# Patient Record
Sex: Male | Born: 1990 | Race: White | Hispanic: No | Marital: Married | Smoking: Never smoker
Health system: Southern US, Community
[De-identification: ages and names within clinical notes are randomized; demographics above are authoritative.]

## PROBLEM LIST (undated history)

## (undated) DIAGNOSIS — I10 Essential (primary) hypertension: Secondary | ICD-10-CM

## (undated) DIAGNOSIS — F3340 Major depressive disorder, recurrent, in remission, unspecified: Secondary | ICD-10-CM

## (undated) HISTORY — DX: Major depressive disorder, recurrent, in remission, unspecified: F33.40

## (undated) HISTORY — DX: Essential (primary) hypertension: I10

---

## 2008-11-13 ENCOUNTER — Emergency Department (HOSPITAL_COMMUNITY): Admission: EM | Admit: 2008-11-13 | Discharge: 2008-11-13 | Payer: Self-pay | Admitting: Emergency Medicine

## 2011-01-04 ENCOUNTER — Ambulatory Visit: Payer: Self-pay

## 2011-01-04 DIAGNOSIS — J039 Acute tonsillitis, unspecified: Secondary | ICD-10-CM

## 2011-01-04 DIAGNOSIS — R509 Fever, unspecified: Secondary | ICD-10-CM

## 2011-01-30 ENCOUNTER — Other Ambulatory Visit (HOSPITAL_COMMUNITY): Payer: Self-pay | Admitting: Orthopedic Surgery

## 2011-01-30 ENCOUNTER — Other Ambulatory Visit (HOSPITAL_COMMUNITY): Payer: Self-pay | Admitting: *Deleted

## 2011-01-30 ENCOUNTER — Encounter (HOSPITAL_COMMUNITY): Payer: Self-pay | Admitting: Pharmacy Technician

## 2011-01-30 MED ORDER — DEXTROSE 5 % IV SOLN: 2.0000 g | INTRAVENOUS | Status: DC

## 2011-01-31 ENCOUNTER — Encounter (HOSPITAL_COMMUNITY): Payer: Self-pay | Admitting: *Deleted

## 2011-01-31 ENCOUNTER — Inpatient Hospital Stay (HOSPITAL_COMMUNITY): Payer: No Typology Code available for payment source | Admitting: Anesthesiology

## 2011-01-31 ENCOUNTER — Encounter (HOSPITAL_COMMUNITY): Payer: Self-pay | Admitting: Anesthesiology

## 2011-01-31 ENCOUNTER — Encounter (HOSPITAL_COMMUNITY)
Admission: EM | Disposition: A | Discharge status: Home / Self Care | Payer: Self-pay | Source: Ambulatory Visit | Attending: Orthopedic Surgery

## 2011-01-31 ENCOUNTER — Ambulatory Visit (HOSPITAL_COMMUNITY)
Admission: EM | Admit: 2011-01-31 | Discharge: 2011-01-31 | Disposition: A | Discharge status: Home / Self Care | Payer: No Typology Code available for payment source | Source: Ambulatory Visit | Attending: Orthopedic Surgery | Admitting: Orthopedic Surgery

## 2011-01-31 DIAGNOSIS — S86011A Strain of right Achilles tendon, initial encounter: Secondary | ICD-10-CM

## 2011-01-31 DIAGNOSIS — S93499A Sprain of other ligament of unspecified ankle, initial encounter: Secondary | ICD-10-CM | POA: Insufficient documentation

## 2011-01-31 DIAGNOSIS — S96819A Strain of other specified muscles and tendons at ankle and foot level, unspecified foot, initial encounter: Secondary | ICD-10-CM | POA: Insufficient documentation

## 2011-01-31 DIAGNOSIS — Y9367 Activity, basketball: Secondary | ICD-10-CM | POA: Insufficient documentation

## 2011-01-31 HISTORY — PX: ACHILLES TENDON SURGERY: SHX542

## 2011-01-31 LAB — CBC
Hemoglobin: 14.9 g/dL (ref 13.0–17.0)
Platelets: 191 10*3/uL (ref 150–400)
RBC: 4.9 MIL/uL (ref 4.22–5.81)
WBC: 4.7 10*3/uL (ref 4.0–10.5)

## 2011-01-31 LAB — SURGICAL PCR SCREEN
MRSA, PCR: NEGATIVE
Staphylococcus aureus: POSITIVE — AB

## 2011-01-31 SURGERY — REPAIR, TENDON, ACHILLES
Anesthesia: General | Site: Leg Lower | Laterality: Right | Wound class: Clean

## 2011-01-31 MED ORDER — PROPOFOL 10 MG/ML IV EMUL
INTRAVENOUS | Status: DC | PRN
  Administered 2011-01-31: 400 mg via INTRAVENOUS

## 2011-01-31 MED ORDER — GLYCOPYRROLATE 0.2 MG/ML IJ SOLN
INTRAMUSCULAR | Status: DC | PRN
  Administered 2011-01-31: .7 mg via INTRAVENOUS

## 2011-01-31 MED ORDER — LACTATED RINGERS IV SOLN
INTRAVENOUS | Status: DC | PRN
  Administered 2011-01-31 (×2): via INTRAVENOUS

## 2011-01-31 MED ORDER — HYDROCODONE-ACETAMINOPHEN 5-500 MG PO TABS: 1.0000 | ORAL_TABLET | Freq: Four times a day (QID) | ORAL | Status: AC | PRN

## 2011-01-31 MED ORDER — NEOSTIGMINE METHYLSULFATE 1 MG/ML IJ SOLN
INTRAMUSCULAR | Status: DC | PRN
  Administered 2011-01-31: 4 mg via INTRAVENOUS

## 2011-01-31 MED ORDER — ROCURONIUM BROMIDE 100 MG/10ML IV SOLN
INTRAVENOUS | Status: DC | PRN
  Administered 2011-01-31: 50 mg via INTRAVENOUS

## 2011-01-31 MED ORDER — 0.9 % SODIUM CHLORIDE (POUR BTL) OPTIME
TOPICAL | Status: DC | PRN
  Administered 2011-01-31: 1000 mL

## 2011-01-31 MED ORDER — HYDROCODONE-ACETAMINOPHEN 5-325 MG PO TABS: 1.0000 | ORAL_TABLET | Freq: Four times a day (QID) | ORAL | Status: DC | PRN

## 2011-01-31 MED ORDER — MORPHINE SULFATE 4 MG/ML IJ SOLN: 0.0500 mg/kg | INTRAMUSCULAR | Status: DC | PRN

## 2011-01-31 MED ORDER — BUPIVACAINE-EPINEPHRINE PF 0.5-1:200000 % IJ SOLN
INTRAMUSCULAR | Status: DC | PRN
  Administered 2011-01-31: 25 mL

## 2011-01-31 MED ORDER — ONDANSETRON HCL 4 MG/2ML IJ SOLN
INTRAMUSCULAR | Status: DC | PRN
  Administered 2011-01-31: 4 mg via INTRAVENOUS

## 2011-01-31 MED ORDER — FENTANYL CITRATE 0.05 MG/ML IJ SOLN
INTRAMUSCULAR | Status: DC | PRN
  Administered 2011-01-31: 50 ug via INTRAVENOUS
  Administered 2011-01-31: 100 ug via INTRAVENOUS

## 2011-01-31 MED ORDER — ONDANSETRON HCL 4 MG/2ML IJ SOLN: 4.0000 mg | Freq: Once | INTRAMUSCULAR | Status: DC | PRN

## 2011-01-31 MED ORDER — MIDAZOLAM HCL 5 MG/5ML IJ SOLN
INTRAMUSCULAR | Status: DC | PRN
  Administered 2011-01-31 (×2): 2 mg via INTRAVENOUS

## 2011-01-31 MED ORDER — OXYCODONE-ACETAMINOPHEN 5-325 MG PO TABS: 1.0000 | ORAL_TABLET | ORAL | Status: AC | PRN

## 2011-01-31 MED ORDER — HYDROMORPHONE HCL PF 1 MG/ML IJ SOLN: 0.2500 mg | INTRAMUSCULAR | Status: DC | PRN

## 2011-01-31 MED ORDER — MUPIROCIN 2 % EX OINT
TOPICAL_OINTMENT | Freq: Two times a day (BID) | CUTANEOUS | Status: DC
  Administered 2011-01-31: 1 via NASAL
  Filled 2011-01-31: qty 22

## 2011-01-31 MED ORDER — CEFAZOLIN SODIUM-DEXTROSE 2-3 GM-% IV SOLR
2.0000 g | INTRAVENOUS | Status: AC
  Administered 2011-01-31: 2 g via INTRAVENOUS

## 2011-01-31 MED ORDER — MEPERIDINE HCL 25 MG/ML IJ SOLN: 6.2500 mg | INTRAMUSCULAR | Status: DC | PRN

## 2011-01-31 SURGICAL SUPPLY — 43 items
5.5mm althread L-15 size 2 maxbraid (Anchor) ×4 IMPLANT
BANDAGE COBAN STERILE 6 (GAUZE/BANDAGES/DRESSINGS) ×2 IMPLANT
BLADE SURG 10 STRL SS (BLADE) ×2 IMPLANT
BNDG COHESIVE 6X5 TAN STRL LF (GAUZE/BANDAGES/DRESSINGS) ×4 IMPLANT
BNDG ESMARK 4X9 LF (GAUZE/BANDAGES/DRESSINGS) IMPLANT
BNDG GAUZE STRTCH 6 (GAUZE/BANDAGES/DRESSINGS) ×2 IMPLANT
CLOTH BEACON ORANGE TIMEOUT ST (SAFETY) ×2 IMPLANT
COTTON STERILE ROLL (GAUZE/BANDAGES/DRESSINGS) ×2 IMPLANT
CUFF TOURNIQUET SINGLE 34IN LL (TOURNIQUET CUFF) IMPLANT
CUFF TOURNIQUET SINGLE 44IN (TOURNIQUET CUFF) IMPLANT
DRAPE INCISE IOBAN 66X45 STRL (DRAPES) ×2 IMPLANT
DRAPE U-SHAPE 47X51 STRL (DRAPES) ×2 IMPLANT
DRSG ADAPTIC 3X8 NADH LF (GAUZE/BANDAGES/DRESSINGS) ×2 IMPLANT
DURAPREP 26ML APPLICATOR (WOUND CARE) ×2 IMPLANT
ELECT REM PT RETURN 9FT ADLT (ELECTROSURGICAL) ×2
ELECTRODE REM PT RTRN 9FT ADLT (ELECTROSURGICAL) ×1 IMPLANT
GLOVE BIOGEL PI IND STRL 9 (GLOVE) ×1 IMPLANT
GLOVE BIOGEL PI INDICATOR 9 (GLOVE) ×1
GLOVE SURG ORTHO 9.0 STRL STRW (GLOVE) ×2 IMPLANT
GOWN PREVENTION PLUS XLARGE (GOWN DISPOSABLE) ×2 IMPLANT
GOWN SRG XL XLNG 56XLVL 4 (GOWN DISPOSABLE) ×1 IMPLANT
GOWN STRL NON-REIN XL XLG LVL4 (GOWN DISPOSABLE) ×1
KIT ROOM TURNOVER OR (KITS) ×2 IMPLANT
MANIFOLD NEPTUNE II (INSTRUMENTS) ×2 IMPLANT
NDL SUT .5 MAYO 1.404X.05X (NEEDLE) ×1 IMPLANT
NEEDLE MAYO TAPER (NEEDLE) ×1
NS IRRIG 1000ML POUR BTL (IV SOLUTION) ×2 IMPLANT
PACK ORTHO EXTREMITY (CUSTOM PROCEDURE TRAY) ×2 IMPLANT
PAD ARMBOARD 7.5X6 YLW CONV (MISCELLANEOUS) ×4 IMPLANT
PADDING WEBRIL 6 STERILE (GAUZE/BANDAGES/DRESSINGS) ×2 IMPLANT
SPLINT PLASTER 5X30 (CAST SUPPLIES) ×2 IMPLANT
SPONGE GAUZE 4X4 12PLY (GAUZE/BANDAGES/DRESSINGS) ×2 IMPLANT
SPONGE GAUZE 4X4 STERILE 39 (GAUZE/BANDAGES/DRESSINGS) ×2 IMPLANT
SPONGE LAP 4X18 X RAY DECT (DISPOSABLE) ×2 IMPLANT
SUT ETHILON 3 0 FSLX (SUTURE) ×2 IMPLANT
SUT FIBERWIRE #2 38 T-5 BLUE (SUTURE)
SUT MNCRL AB 3-0 PS2 18 (SUTURE) ×2 IMPLANT
SUTURE FIBERWR #2 38 T-5 BLUE (SUTURE) IMPLANT
TOWEL OR 17X24 6PK STRL BLUE (TOWEL DISPOSABLE) ×2 IMPLANT
TOWEL OR 17X26 10 PK STRL BLUE (TOWEL DISPOSABLE) ×2 IMPLANT
TUBE CONNECTING 12X1/4 (SUCTIONS) ×2 IMPLANT
WATER STERILE IRR 1000ML POUR (IV SOLUTION) ×2 IMPLANT
YANKAUER SUCT BULB TIP NO VENT (SUCTIONS) ×2 IMPLANT

## 2011-01-31 NOTE — H&P (Signed)
Charles Estrada is an 21 y.o. male.   Chief Complaint: Right Achilles tendon rupture HPI: Patient is a 21 year old gentleman who sustained a rupture of his right Achilles while playing basketball. Patient has previously had a intrasubstance repair of the Achilles tendon.  History reviewed. No pertinent past medical history.  Past Surgical History  Procedure Date  . Achilles tendon repair     History reviewed. No pertinent family history. Social History:  does not have a smoking history on file. He has never used smokeless tobacco. He reports that he does not drink alcohol or use illicit drugs.  Allergies: No Known Allergies  Medications Prior to Admission  Medication Dose Route Frequency Provider Last Rate Last Dose  . ceFAZolin (ANCEF) IVPB 2 g/50 mL premix  2 g Intravenous On Call to OR Nadara Mustard, MD      . mupirocin ointment (BACTROBAN) 2 %   Nasal BID Nadara Mustard, MD   1 application at 01/31/11 [phone]  . DISCONTD: ceFAZolin (ANCEF) 2 g in dextrose 5 % 50 mL IVPB  2 g Intravenous 60 min Pre-Op Nadara Mustard, MD      . DISCONTD: ceFAZolin (ANCEF) 2 g in dextrose 5 % 50 mL IVPB  2 g Intravenous 60 min Pre-Op Nadara Mustard, MD       No current outpatient prescriptions on file as of 01/31/2011.    No results found for this or any previous visit (from the past 48 hour(s)). No results found.  Review of Systems  All other systems reviewed and are negative.    Blood pressure 124/75, pulse 54, temperature 97.8 F (36.6 C), temperature source Oral, resp. rate 18, height 6\' 5"  (1.956 m), weight 95.255 kg (210 lb), SpO2 98.00%. Physical Exam on examination patient has a palpable defect of the Achilles. The Achilles has a torn from the os calcis. He does have a very small abrasion over the skin of about 2 mm. This does have superficial epithelialization.  Assessment/Plan Assessment: Right Achilles tendon rupture with avulsion off the bone. Plan: We'll plan for reconstruction of the  Achilles tendon. Risks and benefits were discussed including infection neurovascular injury nonhealing potential weakness rerupture rate need for additional surgery patient states he understands and wished to proceed at this time.  Charles Estrada V 01/31/2011, 7:34 AM

## 2011-01-31 NOTE — Anesthesia Preprocedure Evaluation (Addendum)
Anesthesia Evaluation  Patient identified by MRN, date of birth, ID band Patient awake    Reviewed: Allergy & Precautions, H&P , NPO status , Patient's Chart, lab work & pertinent test results, reviewed documented beta blocker date and time   History of Anesthesia Complications Negative for: history of anesthetic complications  Airway Mallampati: I TM Distance: >3 FB Neck ROM: Full    Dental  (+) Teeth Intact and Dental Advisory Given   Pulmonary  clear to auscultation        Cardiovascular neg cardio ROS Regular Normal    Neuro/Psych Negative Neurological ROS  Negative Psych ROS   GI/Hepatic negative GI ROS, Neg liver ROS,   Endo/Other  Negative Endocrine ROS  Renal/GU negative Renal ROS     Musculoskeletal   Abdominal   Peds  Hematology negative hematology ROS (+)   Anesthesia Other Findings   Reproductive/Obstetrics                          Anesthesia Physical Anesthesia Plan  ASA: I  Anesthesia Plan: General   Post-op Pain Management:    Induction: Intravenous  Airway Management Planned: Oral ETT  Additional Equipment:   Intra-op Plan:   Post-operative Plan: Extubation in OR  Informed Consent: I have reviewed the patients History and Physical, chart, labs and discussed the procedure including the risks, benefits and alternatives for the proposed anesthesia with the patient or authorized representative who has indicated his/her understanding and acceptance.   Dental advisory given  Plan Discussed with: CRNA, Surgeon and Anesthesiologist  Anesthesia Plan Comments:        Anesthesia Quick Evaluation

## 2011-01-31 NOTE — Op Note (Signed)
OPERATIVE REPORT  DATE OF SURGERY: 01/31/2011  PATIENT:  Charles Estrada,  21 y.o. 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 While playing basketball patient sustained a rupture off the insertion of the os calcis of the Achilles. Patient presents at this time for open reduction internal fixation of the Achilles tendon. Risks and benefits were discussed including infection neurovascular injury persistent pain nonhealing of the wound need for additional surgery patient states he understands and wishes to proceed at this time. Description of procedure patient brought to the OR room 1 after undergoing a popliteal block he then underwent a general anesthetic. After adequate levels of anesthesia were obtained patient was placed in the prone position and the right lower extremity was prepped using DuraPrep and draped into a sterile field Ioban was used to cover all exposed skin. A incision was made in-line with his previous incision through the distal aspect of the os calcis. 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 The insertion of the Achilles on the os calcis was debrided back to bleeding viable bone and 25.5 absorbable anchors were placed into the os calcis. These anchors each had 4 sutures exiting. Using a Krakauer technique the 2 sutures from each anchor for total of 4 sutures were weaved through the Achilles tendon using a Krakauer technique. These were then secured with the foot in  plantarflexion and the Achilles tendon repaired back to its anatomic insertion and there was a good tension on the Achilles and good tendon to bone interface. The wound was irrigated with normal saline the peritenon was closed using 3-0 Monocryl the skin was closing a 2-0 nylon with a suture technique with no sutures going over the wound. The wound edges approximated well. Patient previously had a very small traumatic wound and this was excised. The wound was covered with Adaptic orthopedic sponges web roll and a posterior and sugar tong splint with the foot in plantar flexion. Patient was extubated taken to the PACU in stable condition plan for discharge to home prescription for Percocet and Vicodin for pain. Strict nonweightbearing.  PLAN OF CARE: Discharge to home after PACU  PATIENT DISPOSITION:  PACU - hemodynamically stable.   Nadara Mustard, MD 01/31/2011 9:11 AM

## 2011-01-31 NOTE — Anesthesia Postprocedure Evaluation (Signed)
  Anesthesia Post-op Note  Patient: Charles Estrada  Procedure(s) Performed:  ACHILLES TENDON REPAIR  Patient Location: PACU  Anesthesia Type: General  Level of Consciousness: awake, alert  and oriented  Airway and Oxygen Therapy: Patient Spontanous Breathing  Post-op Pain: none  Post-op Assessment: Post-op Vital signs reviewed, Patient's Cardiovascular Status Stable, Respiratory Function Stable, Patent Airway, No signs of Nausea or vomiting and Pain level controlled  Post-op Vital Signs: Reviewed and stable  Complications: No apparent anesthesia complications

## 2011-01-31 NOTE — Transfer of Care (Signed)
Immediate Anesthesia Transfer of Care Note  Patient: Charles Estrada  Procedure(s) Performed:  ACHILLES TENDON REPAIR  Patient Location: PACU  Anesthesia Type: General and Regional  Level of Consciousness: sedated  Airway & Oxygen Therapy: Patient Spontanous Breathing and Patient connected to nasal cannula oxygen  Post-op Assessment: Report given to PACU RN and Post -op Vital signs reviewed and stable  Post vital signs: Reviewed  Complications: No apparent anesthesia complications

## 2011-01-31 NOTE — Anesthesia Procedure Notes (Addendum)
Anesthesia Regional Block:  Popliteal block  Pre-Anesthetic Checklist: ,, timeout performed, Correct Patient, Correct Site, Correct Laterality, Correct Procedure, Correct Position, site marked, Risks and benefits discussed,  Surgical consent,  Pre-op evaluation,  At surgeon's request and post-op pain management  Laterality: Right and Lower  Prep: chloraprep       Needles:  Injection technique: Single-shot  Needle Type: Echogenic Needle     Needle Length: 8cm  Needle Gauge: 22 and 22 G    Additional Needles: Popliteal block Narrative:  Start time: 01/31/2011 7:25 AM End time: 01/31/2011 7:35 AM Injection made incrementally with aspirations every 5 mL.  Performed by: Personally  Anesthesiologist: Sheldon Silvan, MD  Additional Notes: Marcaine 0.5% with EPI 1:[id number]   [place] Image was not obtained in printed form due to malfunction of attached printer. US guidance was used.  Popliteal block Procedure Name: Intubation Date/Time: 01/31/2011 7:51 AM Performed by: Margaree Mackintosh Pre-anesthesia Checklist: Patient identified, Timeout performed, Emergency Drugs available, Suction available and Patient being monitored Patient Re-evaluated:Patient Re-evaluated prior to inductionOxygen Delivery Method: Circle System Utilized Preoxygenation: Pre-oxygenation with 100% oxygen Intubation Type: IV induction Ventilation: Mask ventilation without difficulty Laryngoscope Size: Mac and 3 Grade View: Grade I Tube type: Oral Tube size: 7.5 mm Number of attempts: 1 Airway Equipment and Method: stylet Placement Confirmation: ETT inserted through vocal cords under direct vision,  positive ETCO2 and breath sounds checked- equal and bilateral Secured at: 22 cm Tube secured with: Tape Dental Injury: Teeth and Oropharynx as per pre-operative assessment

## 2011-01-31 NOTE — Preoperative (Signed)
Beta Blockers   Reason not to administer Beta Blockers:Not Applicable. No home beta blockers 

## 2011-02-09 ENCOUNTER — Encounter (HOSPITAL_COMMUNITY): Payer: Self-pay | Admitting: Orthopedic Surgery

## 2014-07-26 ENCOUNTER — Encounter: Payer: Self-pay | Admitting: Emergency Medicine

## 2014-07-26 ENCOUNTER — Emergency Department
Admission: EM | Admit: 2014-07-26 | Discharge: 2014-07-26 | Disposition: A | Discharge status: Home / Self Care | Payer: 59 | Source: Home / Self Care | Attending: Family Medicine | Admitting: Family Medicine

## 2014-07-26 DIAGNOSIS — M62830 Muscle spasm of back: Secondary | ICD-10-CM | POA: Diagnosis not present

## 2014-07-26 DIAGNOSIS — S29012A Strain of muscle and tendon of back wall of thorax, initial encounter: Secondary | ICD-10-CM

## 2014-07-26 MED ORDER — CYCLOBENZAPRINE HCL 10 MG PO TABS: 10.0000 mg | ORAL_TABLET | Freq: Two times a day (BID) | ORAL | Status: DC | PRN

## 2014-07-26 MED ORDER — MELOXICAM 7.5 MG PO TABS: 7.5000 mg | ORAL_TABLET | Freq: Every day | ORAL | Status: DC

## 2014-07-26 NOTE — ED Notes (Signed)
Right scapula pain x 2 weeks, worse today, denies trauma

## 2014-07-26 NOTE — ED Provider Notes (Signed)
CSN: [id number]     Arrival date & time 07/26/14  1157 History   First MD Initiated Contact with Patient 07/26/14 1159     Chief Complaint  Patient presents with  . Back Pain   (Consider location/radiation/quality/duration/timing/severity/associated sxs/prior Treatment) HPI Patient is a 24 year old male presenting to urgent care with complaint of two-week history of gradually worsening right sided mid-upper back pain.  Pain has worsened within the last 2-3 days.  Pain is achy, sore and sharp on occasion. Moderate in severity.  He has not tried anything for pain yet. States he sits at a desk all day and has been sitting at a desk more recently studying. No known injuries. Denies falls or heavy lifting. Denies fever, chills, n/v/d. Denies numbness or weakness in arms or legs. No prior hx of back injuries or surgeries.   History reviewed. No pertinent past medical history. Past Surgical History  Procedure Laterality Date  . Achilles tendon repair    . Achilles tendon surgery  01/31/2011    Procedure: ACHILLES TENDON REPAIR;  Surgeon: Nadara Mustard, MD;  [facility] OR;  Service: Orthopedics;  Laterality: Right;   No family history on file. History  Substance Use Topics  . Smoking status: Never Smoker   . Smokeless tobacco: Never Used  . Alcohol Use: Yes    Review of Systems  Constitutional: Negative for fever and chills.  Gastrointestinal: Negative for nausea, vomiting, abdominal pain and diarrhea.  Genitourinary: Negative for dysuria, frequency, hematuria and flank pain.  Musculoskeletal: Positive for myalgias and back pain. Negative for arthralgias, gait problem, neck pain and neck stiffness.  Skin: Negative for rash and wound.  Neurological: Negative for weakness and numbness.    Allergies  Review of patient's allergies indicates not on file.  Home Medications   Prior to Admission medications   Medication Sig Start Date End Date Taking? Authorizing Provider  cyclobenzaprine  (FLEXERIL) 10 MG tablet Take 1 tablet (10 mg total) by mouth 2 (two) times daily as needed for muscle spasms. 07/26/14   Junius Finner, PA-C  HYDROcodone-acetaminophen (NORCO) 5-325 MG per tablet Take 1 tablet by mouth every 6 (six) hours as needed. For pain    Historical Provider, MD  meloxicam (MOBIC) 7.5 MG tablet Take 1 tablet (7.5 mg total) by mouth daily. 07/26/14   Junius Finner, PA-C   BP 146/93 mmHg  Pulse 74  Temp(Src) 98.3 F (36.8 C) (Oral)  Ht  (1.981 m)  Wt 211 lb (95.709 kg)  BMI 24.39 kg/m2  SpO2 99% Physical Exam  Constitutional: He is oriented to person, place, and time. He appears well-developed and well-nourished.  HENT:  Head: Normocephalic and atraumatic.  Eyes: EOM are normal.  Neck: Normal range of motion. Neck supple.  No midline bone tenderness, no crepitus or step-offs.   Cardiovascular: Normal rate.   Pulmonary/Chest: Effort normal.  Musculoskeletal: Normal range of motion. He exhibits tenderness.  No midline spinal tenderness. FROM upper and lower extremities with 5/5 strength bilaterally. Tenderness to Right side thoracic muscles under scapula.  Increased pain with full Right arm abduction and extension.  Increased pain with flexion of Right shoulder against resistance.   Neurological: He is alert and oriented to person, place, and time.  Skin: Skin is warm and dry. No rash noted. No erythema.  Psychiatric: He has a normal mood and affect. His behavior is normal.  Nursing note and vitals reviewed.   ED Course  Procedures (including critical care time) Labs Review Labs Reviewed -  No data to display  Imaging Review No results found.   MDM   1. Muscle strain of right upper back, initial encounter   2. Muscle spasm of back     Patient complaining of right mid upper back pain for 2 weeks.  Pain is reproducible with palpation to muscles.  No bony tenderness.  Full range of motion of her extremities.  No imaging indicated. Rx: flexeril and  meloxicam. Discussed alternating ice and heat. Provided home exercises. F/u with PCP in 1-2 weeks if not improving. Return precautions provided. Pt verbalized understanding and agreement with tx plan.    Junius Finner, PA-C 07/26/14 1229

## 2014-07-26 NOTE — Discharge Instructions (Signed)
Flexeril is a muscle relaxer and may cause drowsiness. Do not drink alcohol, drive, or operate heavy machinery while taking. Meloxicam (Mobic) is an antiinflammatory to help with pain and inflammation.  Do not take ibuprofen, Advil, Aleve, or any other medications that contain NSAIDs while taking meloxicam as this may cause stomach upset or even ulcers if taken in large amounts for an extended period of time.  See below for further instructions.

## 2015-05-29 ENCOUNTER — Emergency Department
Admission: EM | Admit: 2015-05-29 | Discharge: 2015-05-29 | Disposition: A | Discharge status: Home / Self Care | Payer: 59 | Source: Home / Self Care | Attending: Family Medicine | Admitting: Family Medicine

## 2015-05-29 ENCOUNTER — Encounter: Payer: Self-pay | Admitting: *Deleted

## 2015-05-29 DIAGNOSIS — J069 Acute upper respiratory infection, unspecified: Secondary | ICD-10-CM | POA: Diagnosis not present

## 2015-05-29 DIAGNOSIS — H6501 Acute serous otitis media, right ear: Secondary | ICD-10-CM

## 2015-05-29 MED ORDER — AMOXICILLIN-POT CLAVULANATE 875-125 MG PO TABS: 1.0000 | ORAL_TABLET | Freq: Two times a day (BID) | ORAL | Status: DC

## 2015-05-29 NOTE — ED Provider Notes (Signed)
CSN: [id number]     Arrival date & time 05/29/15  0813 History   First MD Initiated Contact with Patient 05/29/15 [phone]     Chief Complaint  Patient presents with  . Nasal Congestion   (Consider location/radiation/quality/duration/timing/severity/associated sxs/prior Treatment) HPI  The pt is a 25yo [facility] with c/o 10 days of Right ear pain and fullness with progressively worsening productive cough for 2 days.  He is also c/o sore throat. Pain in ear and throat is moderate in severity. He notes he flew back from the [place] last week, which is when the ear pain started.  He has been taking Suafed and Allegra w/o relief. His wife was sick before their trip to the [place].  He did have a fever Tmax 101*F two days ago but it resolved with ibuprofen. Denies n/v/d.   History reviewed. No pertinent past medical history. Past Surgical History  Procedure Laterality Date  . Achilles tendon repair    . Achilles tendon surgery  01/31/2011    Procedure: ACHILLES TENDON REPAIR;  Surgeon: Nadara MustardMarcus V Duda, MD;  [facility];  Service: Orthopedics;  Laterality: Right;   Family History  Problem Relation Age of Onset  . Hypertension Mother   . Hypertension Father   . Hypertension Brother    Social History  Substance Use Topics  . Smoking status: Never Smoker   . Smokeless tobacco: Never Used  . Alcohol Use: Yes    Review of Systems  Constitutional: Positive for fever. Negative for chills.  HENT: Positive for congestion, ear pain (Right), rhinorrhea, sinus pressure, sneezing and sore throat. Negative for trouble swallowing and voice change.   Respiratory: Positive for cough. Negative for shortness of breath.   Cardiovascular: Negative for chest pain and palpitations.  Gastrointestinal: Negative for nausea, vomiting, abdominal pain and diarrhea.  Musculoskeletal: Negative for myalgias, back pain and arthralgias.  Skin: Negative for rash.  Neurological: Positive for headaches.  Negative for dizziness and light-headedness.    Allergies  Review of patient's allergies indicates no known allergies.  Home Medications   Prior to Admission medications   Medication Sig Start Date End Date Taking? Authorizing Provider  fexofenadine (ALLEGRA) 180 MG tablet Take 180 mg by mouth daily.   Yes Historical Provider, MD  pseudoephedrine (SUDAFED) 30 MG tablet Take 30 mg by mouth every 4 (four) hours as needed for congestion.   Yes Historical Provider, MD  amoxicillin-clavulanate (AUGMENTIN) 875-125 MG tablet Take 1 tablet by mouth 2 (two) times daily. One po bid x 7 days 05/29/15   Junius FinnerErin O'Malley, PA-C   Meds Ordered and Administered this Visit  Medications - No data to display  BP 130/84 mmHg  Pulse 97  Temp(Src) 98.2 F (36.8 C) (Oral)  Resp 16  Ht 6\' 6"  (1.981 m)  Wt 216 lb (97.977 kg)  BMI 24.97 kg/m2  SpO2 97% No data found.   Physical Exam  Constitutional: He appears well-developed and well-nourished.  HENT:  Head: Normocephalic and atraumatic.  Right Ear: Tympanic membrane is erythematous and bulging.  Left Ear: Tympanic membrane is erythematous. Tympanic membrane is not retracted and not bulging.  Nose: Mucosal edema present. Right sinus exhibits no maxillary sinus tenderness and no frontal sinus tenderness. Left sinus exhibits no maxillary sinus tenderness and no frontal sinus tenderness.  Mouth/Throat: Uvula is midline and mucous membranes are normal. Posterior oropharyngeal edema and posterior oropharyngeal erythema present. No oropharyngeal exudate or tonsillar abscesses.  Eyes: Conjunctivae are normal. No scleral icterus.  Neck: Normal range of motion. Neck supple.  Cardiovascular: Normal rate, regular rhythm and normal heart sounds.   Pulmonary/Chest: Effort normal and breath sounds normal. No stridor. No respiratory distress. He has no wheezes. He has no rales.  Abdominal: Soft. He exhibits no distension. There is no tenderness.  Musculoskeletal:  Normal range of motion.  Lymphadenopathy:    He has no cervical adenopathy.  Neurological: He is alert.  Skin: Skin is warm and dry.  Nursing note and vitals reviewed.   ED Course  Procedures (including critical care time)  Labs Review Labs Reviewed - No data to display  Imaging Review No results found.    MDM   1. Right acute serous otitis media, recurrence not specified   2. Acute upper respiratory infection    Pt c/o worsening URI symptoms following a plane flight. Sick contacts-his wife.  Exam c/w Right AOM.  Rx: Augmentin   May continue OTC sudafed, acetaminophen and ibuprofen. F/u with PCP in 7-10 days if not improving. Patient verbalized understanding and agreement with treatment plan.     Junius Finner, PA-C 05/29/15 [phone]

## 2015-05-29 NOTE — Discharge Instructions (Signed)
You may take 400-600mg  Ibuprofen (Motrin) every 6-8 hours for fever and pain  Alternate with Tylenol  You may take 500mg  Tylenol every 4-6 hours as needed for fever and pain  Follow-up with your primary care provider next week for recheck of symptoms if not improving.  Be sure to drink plenty of fluids and rest, at least 8hrs of sleep a night, preferably more while you are sick. Return urgent care or go to closest ER if you cannot keep down fluids/signs of dehydration, fever not reducing with Tylenol, difficulty breathing/wheezing, stiff neck, worsening condition, or other concerns (see below)  Please take antibiotics as prescribed and be sure to complete entire course even if you start to feel better to ensure infection does not come back.  You may continue to take over the counter plain mucinex and sudafed to help with sinus congestion.   Cool Mist Vaporizers Vaporizers may help relieve the symptoms of a cough and cold. They add moisture to the air, which helps mucus to become thinner and less sticky. This makes it easier to breathe and cough up secretions. Cool mist vaporizers do not cause serious burns like hot mist vaporizers, which may also be called steamers or humidifiers. Vaporizers have not been proven to help with colds. You should not use a vaporizer if you are allergic to mold. HOME CARE INSTRUCTIONS  Follow the package instructions for the vaporizer.  Do not use anything other than distilled water in the vaporizer.  Do not run the vaporizer all of the time. This can cause mold or bacteria to grow in the vaporizer.  Clean the vaporizer after each time it is used.  Clean and dry the vaporizer well before storing it.  Stop using the vaporizer if worsening respiratory symptoms develop.   This information is not intended to replace advice given to you by your health care provider. Make sure you discuss any questions you have with your health care provider.   Document Released:  09/19/2003 Document Revised: 12/27/2012 Document Reviewed: 05/11/2012 Elsevier Interactive Patient Education [employer]2016 [employer]

## 2015-05-29 NOTE — ED Notes (Signed)
Pt c/o nasal congestion and RT ear fullness x 10 days, with productive cough x 2 days. He has taken Sudafed and Allegra with minimal relief.

## 2016-02-13 DIAGNOSIS — H40003 Preglaucoma, unspecified, bilateral: Secondary | ICD-10-CM | POA: Diagnosis not present

## 2016-02-13 DIAGNOSIS — H5213 Myopia, bilateral: Secondary | ICD-10-CM | POA: Diagnosis not present

## 2016-02-13 DIAGNOSIS — H527 Unspecified disorder of refraction: Secondary | ICD-10-CM | POA: Diagnosis not present

## 2016-03-11 ENCOUNTER — Ambulatory Visit: Payer: 59 | Admitting: Physician Assistant

## 2016-03-13 ENCOUNTER — Encounter: Payer: Self-pay | Admitting: Physician Assistant

## 2016-03-13 ENCOUNTER — Ambulatory Visit (INDEPENDENT_AMBULATORY_CARE_PROVIDER_SITE_OTHER): Payer: 59 | Admitting: Physician Assistant

## 2016-03-13 ENCOUNTER — Other Ambulatory Visit: Payer: Self-pay

## 2016-03-13 VITALS — BP 146/75 | HR 70 | Ht 78.0 in | Wt 221.0 lb

## 2016-03-13 DIAGNOSIS — Z0000 Encounter for general adult medical examination without abnormal findings: Secondary | ICD-10-CM | POA: Diagnosis not present

## 2016-03-13 DIAGNOSIS — R110 Nausea: Secondary | ICD-10-CM | POA: Insufficient documentation

## 2016-03-13 DIAGNOSIS — R030 Elevated blood-pressure reading, without diagnosis of hypertension: Secondary | ICD-10-CM | POA: Insufficient documentation

## 2016-03-13 LAB — CBC
HEMATOCRIT: 47.6 % (ref 38.5–50.0)
Hemoglobin: 16.3 g/dL (ref 13.2–17.1)
MCH: 30 pg (ref 27.0–33.0)
MCHC: 34.2 g/dL (ref 32.0–36.0)
MCV: 87.5 fL (ref 80.0–100.0)
MPV: 11.7 fL (ref 7.5–12.5)
Platelets: 228 10*3/uL (ref 140–400)
RBC: 5.44 MIL/uL (ref 4.20–5.80)
RDW: 13 % (ref 11.0–15.0)
WBC: 5.5 10*3/uL (ref 3.8–10.8)

## 2016-03-13 LAB — COMPREHENSIVE METABOLIC PANEL
ALK PHOS: 61 U/L (ref 40–115)
ALT: 17 U/L (ref 9–46)
AST: 19 U/L (ref 10–40)
Albumin: 5.2 g/dL — ABNORMAL HIGH (ref 3.6–5.1)
BUN: 14 mg/dL (ref 7–25)
CALCIUM: 9.9 mg/dL (ref 8.6–10.3)
CO2: 28 mmol/L (ref 20–31)
Chloride: 102 mmol/L (ref 98–110)
Creat: 1.11 mg/dL (ref 0.60–1.35)
Glucose, Bld: 87 mg/dL (ref 65–99)
POTASSIUM: 4.9 mmol/L (ref 3.5–5.3)
Sodium: 136 mmol/L (ref 135–146)
Total Bilirubin: 1 mg/dL (ref 0.2–1.2)
Total Protein: 8.1 g/dL (ref 6.1–8.1)

## 2016-03-13 LAB — LIPASE: Lipase: 36 U/L (ref 7–60)

## 2016-03-13 LAB — LIPID PANEL W/REFLEX DIRECT LDL
CHOL/HDL RATIO: 3.7 ratio (ref <5.0)
Cholesterol: 185 mg/dL (ref <200)
HDL: 50 mg/dL (ref >40)
LDL-CHOLESTEROL: 115 mg/dL — AB
Non-HDL Cholesterol (Calc): 135 mg/dL — ABNORMAL HIGH (ref <130)
TRIGLYCERIDES: 98 mg/dL (ref <150)

## 2016-03-13 LAB — AMYLASE: AMYLASE: 60 U/L (ref 0–105)

## 2016-03-13 MED ORDER — ONDANSETRON 4 MG PO TBDP: 4.0000 mg | ORAL_TABLET | Freq: Four times a day (QID) | ORAL | 0 refills | Status: DC | PRN

## 2016-03-13 MED FILL — ONDANSETRON ODT 4 MG TABLET: 4 | 4 days supply | Qty: 16 | Fill #0

## 2016-03-13 NOTE — Progress Notes (Signed)
HPI:                                                                Charles Estrada is a 26 y.o. male who [facility] today to establish care   Current Concerns include nausea  Patient reports intermittent nausea spells x 4 weeks. Lasts for an entire day. No known trigger. Denies change in bowel movements. No vomiting, constipation, diarrhea. Denies fevers, chills, night sweats, weight loss. Denies dysuria, frequency, urgency. No recent travel.  Health Maintenance Health Maintenance  Topic Date Due  . HIV Screening  06/29/2003  . TETANUS/TDAP  09/05/2024  . INFLUENZA VACCINE  Completed     Health Habits  Diet: good  Exercise: cardio, spin, lifting x 6 days/ week  ETOH: 10 drinks per week  Tobacco: none  Drugs: none  Past Medical History:  Diagnosis Date  . Seasonal affective disorder in remission [facility](HCC)    Past Surgical History:  Procedure Laterality Date  . ACHILLES TENDON SURGERY  01/31/2011   Procedure: ACHILLES TENDON REPAIR;  Surgeon: Nadara MustardMarcus V Duda, MD;  Location: [facility] OR;  Service: Orthopedics;  Laterality: Right;   Social History  Substance Use Topics  . Smoking status: Never Smoker  . Smokeless tobacco: Never Used  . Alcohol use 6.0 oz/week    10 Cans of beer per week   family history includes Hypertension in his brother and father.  ROS: negative except as noted in the HPI  Medications: Current Outpatient Prescriptions  Medication Sig Dispense Refill  . ondansetron (ZOFRAN-ODT) 4 MG disintegrating tablet Take 1 tablet (4 mg total) by mouth every 6 (six) hours as needed for nausea or vomiting. 16 tablet 0   No current facility-administered medications for this visit.    No Known Allergies     Objective:  BP (!) 146/75   Pulse 70   Ht 6\' 6"  (1.981 m)   Wt 221 lb (100.2 kg)   BMI 25.54 kg/m  Gen: well-groomed, cooperative, not ill-appearing, no distress HEENT: normal conjunctiva,  TM's clear, oropharynx clear, moist mucus membranes, no thyromegaly or tenderness Pulm: Normal work of breathing, clear to auscultation bilaterally CV: Normal rate, regular rhythm, s1 and s2 distinct, no murmurs, clicks or rubs appreciated on this exam,  GI: soft, nondistended, nontender, no masses Neuro: alert and oriented x 3, EOM's intact, PERRLA MSK: strength 5/5 and symmetric, normal gait, distal pulses intact, no peripheral edema Skin: warm and dry, no rashes or lesions on exposed skin Psych: normal affect, pleasant mood, normal speech and thought content  [facility] 03/13/2016  Decreased Interest 0  Down, Depressed, Hopeless 0  PHQ - 2 Score 0    Assessment and Plan: 26 y.o. male with   1. Elevated blood pressure reading - check blood pressures at home - DASH eating plan - follow-up in 4 weeks  2. Nausea - start journal of preceding activities/foods - Amylase - Lipase - ondansetron (ZOFRAN-ODT) 4 MG disintegrating tablet; Take 1 tablet (4 mg total) by mouth every 6 (six) hours as needed for nausea or vomiting.  Dispense: 16 tablet; Refill: 0 - Hemoglobin A1c  3. Encounter for preventive health examination - CBC - Comprehensive metabolic panel - Lipid Panel  w/reflex Direct LDL - Tdap up to date - Declines HIV/STI testing   Patient education and anticipatory guidance given Patient agrees with treatment plan Follow-up in 4 weeks or sooner as needed  Levonne Hubert PA-C

## 2016-03-13 NOTE — Patient Instructions (Signed)
Take zofran as needed for nausea Start journal of nausea episodes - activities and food preceding the event  Check blood pressures at home in a relaxed setting at least 3 days per week at the same time each day Limit salt Return in 4 weeks  How to Take Your Blood Pressure Blood pressure is a measurement of how strongly your blood is pressing against the walls of your arteries. Arteries are blood vessels that carry blood from your heart throughout your body. Your health care provider takes your blood pressure at each office visit. You can also take your own blood pressure at home with a blood pressure machine. You may need to take your own blood pressure:  To confirm a diagnosis of high blood pressure (hypertension).  To monitor your blood pressure over time.  To make sure your blood pressure medicine is working. Supplies needed: To take your blood pressure, you will need a blood pressure machine. You can buy a blood pressure machine, or blood pressure monitor, at most drugstores or online. There are several types of home blood pressure monitors. When choosing one, consider the following:  Choose a monitor that has an arm cuff.  Choose a monitor that wraps snugly around your upper arm. You should be able to fit only one finger between your arm and the cuff.  Do not choose a monitor that measures your blood pressure from your wrist or finger. Your health care provider can suggest a reliable monitor that will meet your needs. How to prepare To get the most accurate reading, avoid the following for 30 minutes before you check your blood pressure:  Drinking caffeine.  Drinking alcohol.  Eating.  Smoking.  Exercising. Five minutes before you check your blood pressure:  Empty your bladder.  Sit quietly without talking in a dining chair, rather than in a soft couch or armchair. How to take your blood pressure To check your blood pressure, follow the instructions in the manual that  came with your blood pressure monitor. If you have a digital blood pressure monitor, the instructions may be as follows: 1. Sit up straight. 2. Place your feet on the floor. Do not cross your ankles or legs. 3. Rest your left arm at the level of your heart on a table or desk or on the arm of a chair. 4. Pull up your shirt sleeve. 5. Wrap the blood pressure cuff around the upper part of your left arm, 1 inch (2.5 cm) above your elbow. It is best to wrap the cuff around bare skin. 6. Fit the cuff snugly around your arm. You should be able to place only one finger between the cuff and your arm. 7. Position the cord inside the groove of your elbow. 8. Press the power button. 9. Sit quietly while the cuff inflates and deflates. 10. Read the digital reading on the monitor screen and write it down (record it). 11. Wait 2-3 minutes, then repeat the steps, starting at step 1. What does my blood pressure reading mean? A blood pressure reading consists of a higher number over a lower number. Ideally, your blood pressure should be below 120/80. The first ("top") number is called the systolic pressure. It is a measure of the pressure in your arteries as your heart beats. The second ("bottom") number is called the diastolic pressure. It is a measure of the pressure in your arteries as the heart relaxes. Blood pressure is classified into four stages. The following are the stages for adults who  do not have a short-term serious illness or a chronic condition. Systolic pressure and diastolic pressure are measured in a unit called mm Hg. Normal   Systolic pressure: below 120.  Diastolic pressure: below 80. Elevated   Systolic pressure: 120-129.  Diastolic pressure: below 80. Hypertension stage 1   Systolic pressure: 130-139.  Diastolic pressure: 80-89. Hypertension stage 2   Systolic pressure: 140 or above.  Diastolic pressure: 90 or above. You can have prehypertension or hypertension even if only  the systolic or only the diastolic number in your reading is higher than normal. Follow these instructions at home:  Check your blood pressure as often as recommended by your health care provider.  Take your monitor to the next appointment with your health care provider to make sure:  That you are using it correctly.  That it provides accurate readings.  Be sure you understand what your goal blood pressure numbers are.  Tell your health care provider if you are having any side effects from blood pressure medicine. Contact a health care provider if:  Your blood pressure is consistently high. Get help right away if:  Your systolic blood pressure is higher than 180.  Your diastolic blood pressure is higher than 110. This information is not intended to replace advice given to you by your health care provider. Make sure you discuss any questions you have with your health care provider. Document Released: 05/31/2015 Document Revised: 08/13/2015 Document Reviewed: 05/31/2015 Elsevier Interactive Patient Education  2017 [employer]

## 2016-03-14 LAB — HEMOGLOBIN A1C
Hgb A1c MFr Bld: 5.1 % (ref <5.7)
Mean Plasma Glucose: 100 mg/dL

## 2016-03-16 ENCOUNTER — Encounter: Payer: Self-pay | Admitting: Physician Assistant

## 2016-04-10 ENCOUNTER — Ambulatory Visit (INDEPENDENT_AMBULATORY_CARE_PROVIDER_SITE_OTHER): Payer: 59 | Admitting: Physician Assistant

## 2016-04-10 VITALS — BP 138/86 | HR 66 | Wt 220.0 lb

## 2016-04-10 DIAGNOSIS — J069 Acute upper respiratory infection, unspecified: Secondary | ICD-10-CM | POA: Diagnosis not present

## 2016-04-10 DIAGNOSIS — I10 Essential (primary) hypertension: Secondary | ICD-10-CM | POA: Insufficient documentation

## 2016-04-10 MED ORDER — IPRATROPIUM BROMIDE 0.06 % NA SOLN: 1.0000 | Freq: Four times a day (QID) | NASAL | 0 refills | Status: DC | PRN

## 2016-04-10 MED ORDER — BENZONATATE 200 MG PO CAPS: 200.0000 mg | ORAL_CAPSULE | Freq: Three times a day (TID) | ORAL | 0 refills | Status: DC | PRN

## 2016-04-10 MED ORDER — AMLODIPINE BESYLATE 5 MG PO TABS: 5.0000 mg | ORAL_TABLET | Freq: Every day | ORAL | 3 refills | Status: DC

## 2016-04-10 NOTE — Progress Notes (Signed)
HPI:                                                                Charles Estrada is a 26 y.o. male who presents to [facility] today for blood pressure follow-up  Patient had elevated BP reading at his establish care visit. Has been checking BP's at home. BP range 129-151/72-90. Denies vision change, headache, chest pain with exertion, orthopnea, lightheadedness, syncope and edema. Family history is significant for HTN in his father and brother. Risk factors include HLD  Patient also reports sore throat, congestion and cough x 3 days. Denies fever, chills, SOB, wheezing, n/v/d. Sick contacts include his wife.    Past Medical History:  Diagnosis Date  . Seasonal affective disorder in remission [facility])    Past Surgical History:  Procedure Laterality Date  . ACHILLES TENDON SURGERY  01/31/2011   Procedure: ACHILLES TENDON REPAIR;  Surgeon: Nadara Mustard, MD;  Location: [facility] OR;  Service: Orthopedics;  Laterality: Right;   Social History  Substance Use Topics  . Smoking status: Never Smoker  . Smokeless tobacco: Never Used  . Alcohol use 6.0 oz/week    10 Cans of beer per week   family history includes Hypertension in his brother and father.  ROS: negative except as noted in the HPI  Medications: No current outpatient prescriptions on file.   No current facility-administered medications for this visit.    No Known Allergies     Objective:  BP 138/86   Pulse 66   Wt 220 lb (99.8 kg)   BMI 25.42 kg/m  Gen: well-groomed, cooperative, not ill-appearing, no distress HEENT: normal conjunctiva, TM's clear, nasal mucosa edematous, oropharynx erythematous, no tonsillar exudates, moist mucus membranes, no frontal or maxillary sinus tenderness Pulm: Normal work of breathing, normal phonation, clear to auscultation bilaterally, no wheezes, rales or rhonchi CV: Normal rate, regular rhythm, s1 and s2 distinct, no murmurs, clicks or rubs   Neuro: alert and oriented x 3, EOM's intact Lymph: there is cervical adenopathy Skin: warm and dry, no rashes or lesions on exposed skin, no cyanosis   No results found for this or any previous visit (from the past 72 hour(s)). No results found.    Assessment and Plan: 26 y.o. male with  1. Essential hypertension - amLODipine (NORVASC) 5 MG tablet; Take 1 tablet (5 mg total) by mouth daily.  Dispense: 30 tablet; Refill: 3 - continue to check pressures at home and log - return in 2 weeks for nurse visit  2. Acute upper respiratory infection - symptomatic management with Tylenol, chloraseptic, nasal spray and cough medicine - ipratropium (ATROVENT) 0.06 % nasal spray; Place 1 spray into both nostrils 4 (four) times daily as needed.  Dispense: 15 mL; Refill: 0 - benzonatate (TESSALON) 200 MG capsule; Take 1 capsule (200 mg total) by mouth 3 (three) times daily as needed for cough.  Dispense: 45 capsule; Refill: 0 - follow-up prn if worsening symptoms or no improvement in 10 days  Patient education and anticipatory guidance given Patient agrees with treatment plan Follow-up in 2 weeks or sooner as needed   Levonne Hubert PA-C

## 2016-04-10 NOTE — Patient Instructions (Addendum)
For Blood Pressure - start Amlodipine daily - continue to check pressures at home and log - return in 2 weeks for nurse visit   - Tylenol  every 8 hours as needed for sore throat and body aches - Chloraseptic spray and salt water gargles as needed for sore throat - Benzonotate:  three times daily as needed for cough - Atrovent nasal spray up to four times per day for nasal congestion and sinus pressure - Drink at least 1 liter of clear liquids - Follow-up if worsening symptoms or no improvement in 10 days   Hypertension Hypertension, commonly called high blood pressure, is when the force of blood pumping through the arteries is too strong. The arteries are the blood vessels that carry blood from the heart throughout the body. Hypertension forces the heart to work harder to pump blood and may cause arteries to become narrow or stiff. Having untreated or uncontrolled hypertension can cause heart attacks, strokes, kidney disease, and other problems. A blood pressure reading consists of a higher number over a lower number. Ideally, your blood pressure should be below 120/80. The first ("top") number is called the systolic pressure. It is a measure of the pressure in your arteries as your heart beats. The second ("bottom") number is called the diastolic pressure. It is a measure of the pressure in your arteries as the heart relaxes. What are the causes? The cause of this condition is not known. What increases the risk? Some risk factors for high blood pressure are under your control. Others are not. Factors you can change   Smoking.  Having type 2 diabetes mellitus, high cholesterol, or both.  Not getting enough exercise or physical activity.  Being overweight.  Having too much fat, sugar, calories, or salt (sodium) in your diet.  Drinking too much alcohol. Factors that are difficult or impossible to change   Having chronic kidney disease.  Having a family history of high  blood pressure.  Age. Risk increases with age.  Race. You may be at higher risk if you are African-American.  Gender. Men are at higher risk than women before age 75. After age 14, women are at higher risk than men.  Having obstructive sleep apnea.  Stress. What are the signs or symptoms? Extremely high blood pressure (hypertensive crisis) may cause:  Headache.  Anxiety.  Shortness of breath.  Nosebleed.  Nausea and vomiting.  Severe chest pain.  Jerky movements you cannot control (seizures). How is this diagnosed? This condition is diagnosed by measuring your blood pressure while you are seated, with your arm resting on a surface. The cuff of the blood pressure monitor will be placed directly against the skin of your upper arm at the level of your heart. It should be measured at least twice using the same arm. Certain conditions can cause a difference in blood pressure between your right and left arms. Certain factors can cause blood pressure readings to be lower or higher than normal (elevated) for a short period of time:  When your blood pressure is higher when you are in a health care provider's office than when you are at home, this is called white coat hypertension. Most people with this condition do not need medicines.  When your blood pressure is higher at home than when you are in a health care provider's office, this is called masked hypertension. Most people with this condition may need medicines to control blood pressure. If you have a high blood pressure reading during one  visit or you have normal blood pressure with other risk factors:  You may be asked to return on a different day to have your blood pressure checked again.  You may be asked to monitor your blood pressure at home for 1 week or longer. If you are diagnosed with hypertension, you may have other blood or imaging tests to help your health care provider understand your overall risk for other  conditions. How is this treated? This condition is treated by making healthy lifestyle changes, such as eating healthy foods, exercising more, and reducing your alcohol intake. Your health care provider may prescribe medicine if lifestyle changes are not enough to get your blood pressure under control, and if:  Your systolic blood pressure is above 130.  Your diastolic blood pressure is above 80. Your personal target blood pressure may vary depending on your medical conditions, your age, and other factors. Follow these instructions at home: Eating and drinking   Eat a diet that is high in fiber and potassium, and low in sodium, added sugar, and fat. An example eating plan is called the DASH (Dietary Approaches to Stop Hypertension) diet. To eat this way:  Eat plenty of fresh fruits and vegetables. Try to fill half of your plate at each meal with fruits and vegetables.  Eat whole grains, such as whole wheat pasta, brown rice, or whole grain bread. Fill about one quarter of your plate with whole grains.  Eat or drink low-fat dairy products, such as skim milk or low-fat yogurt.  Avoid fatty cuts of meat, processed or cured meats, and poultry with skin. Fill about one quarter of your plate with lean proteins, such as fish, chicken without skin, beans, eggs, and tofu.  Avoid premade and processed foods. These tend to be higher in sodium, added sugar, and fat.  Reduce your daily sodium intake. Most people with hypertension should eat less than 1,500 mg of sodium a day.  Limit alcohol intake to no more than 1 drink a day for nonpregnant women and 2 drinks a day for men. One drink equals 12 oz of beer, 5 oz of wine, or 1 oz of hard liquor. Lifestyle   Work with your health care provider to maintain a healthy body weight or to lose weight. Ask what an ideal weight is for you.  Get at least 30 minutes of exercise that causes your heart to beat faster (aerobic exercise) most days of the week.  Activities may include walking, swimming, or biking.  Include exercise to strengthen your muscles (resistance exercise), such as pilates or lifting weights, as part of your weekly exercise routine. Try to do these types of exercises for 30 minutes at least 3 days a week.  Do not use any products that contain nicotine or tobacco, such as cigarettes and e-cigarettes. If you need help quitting, ask your health care provider.  Monitor your blood pressure at home as told by your health care provider.  Keep all follow-up visits as told by your health care provider. This is important. Medicines   Take over-the-counter and prescription medicines only as told by your health care provider. Follow directions carefully. Blood pressure medicines must be taken as prescribed.  Do not skip doses of blood pressure medicine. Doing this puts you at risk for problems and can make the medicine less effective.  Ask your health care provider about side effects or reactions to medicines that you should watch for. Contact a health care provider if:  You think you  are having a reaction to a medicine you are taking.  You have headaches that keep coming back (recurring).  You feel dizzy.  You have swelling in your ankles.  You have trouble with your vision. Get help right away if:  You develop a severe headache or confusion.  You have unusual weakness or numbness.  You feel faint.  You have severe pain in your chest or abdomen.  You vomit repeatedly.  You have trouble breathing. Summary  Hypertension is when the force of blood pumping through your arteries is too strong. If this condition is not controlled, it may put you at risk for serious complications.  Your personal target blood pressure may vary depending on your medical conditions, your age, and other factors. For most people, a normal blood pressure is less than 120/80.  Hypertension is treated with lifestyle changes, medicines, or a  combination of both. Lifestyle changes include weight loss, eating a healthy, low-sodium diet, exercising more, and limiting alcohol. This information is not intended to replace advice given to you by your health care provider. Make sure you discuss any questions you have with your health care provider. Document Released: 12/22/2004 Document Revised: 11/20/2015 Document Reviewed: 11/20/2015 Elsevier Interactive Patient Education  2017 [employer].

## 2016-04-24 ENCOUNTER — Ambulatory Visit (INDEPENDENT_AMBULATORY_CARE_PROVIDER_SITE_OTHER): Payer: 59 | Admitting: Physician Assistant

## 2016-04-24 VITALS — BP 140/63 | HR 77 | Wt 220.0 lb

## 2016-04-24 DIAGNOSIS — I10 Essential (primary) hypertension: Secondary | ICD-10-CM

## 2016-04-24 NOTE — Progress Notes (Signed)
Patient is here for BP recheck. He has been consistently taking Norvasc 5 mg once a day. He denies HA, fatigue, dizziness, heart palpitations. He does bring in BP log today which will be placed in providers box for review. He does state that typically his BP will be slightly elevated and then upon recheck his BP will have dropped to normal range. BP was rechecked today after patient waited about 5 min. Upon recheck bp was slightly higher.( see second bp check)

## 2016-04-27 ENCOUNTER — Telehealth: Payer: Self-pay

## 2016-04-27 NOTE — Telephone Encounter (Signed)
-----   Message from [facility] [place] sent at 04/24/2016  5:54 PM EDT ----- Tell patient to continue his blood pressure medication at the current dose. Continue to watch his diet. Follow-up in 3 months.

## 2016-04-27 NOTE — Telephone Encounter (Signed)
Left recommendations on vm -EH/RMA  

## 2016-08-20 MED FILL — AMLODIPINE BESYLATE 5 MG TA: 5 | 60 days supply | Qty: 60 | Fill #0

## 2016-10-28 ENCOUNTER — Other Ambulatory Visit: Payer: Self-pay | Admitting: Physician Assistant

## 2016-10-28 DIAGNOSIS — I10 Essential (primary) hypertension: Secondary | ICD-10-CM

## 2016-10-28 MED FILL — AMLODIPINE BESYLATE 5 MG TA: 5 | 30 days supply | Qty: 30 | Fill #0

## 2016-11-30 MED FILL — AMLODIPINE BESYLATE 5 MG TA: 5 | 30 days supply | Qty: 30 | Fill #1

## 2017-01-06 ENCOUNTER — Other Ambulatory Visit: Payer: Self-pay | Admitting: Physician Assistant

## 2017-01-06 DIAGNOSIS — I10 Essential (primary) hypertension: Secondary | ICD-10-CM

## 2017-01-11 ENCOUNTER — Telehealth: Payer: Self-pay | Admitting: Physician Assistant

## 2017-01-11 ENCOUNTER — Other Ambulatory Visit: Payer: Self-pay

## 2017-01-11 DIAGNOSIS — I10 Essential (primary) hypertension: Secondary | ICD-10-CM

## 2017-01-11 MED ORDER — AMLODIPINE BESYLATE 5 MG PO TABS: 5.0000 mg | ORAL_TABLET | Freq: Every day | ORAL | 0 refills | Status: DC

## 2017-01-11 MED FILL — AMLODIPINE BESYLATE 5 MG TA: 5 | 30 days supply | Qty: 30 | Fill #0

## 2017-01-11 NOTE — Telephone Encounter (Signed)
Pt called and stated he needs a refill on his BP meds. He has an appointment this Thurs. But is completely out. Thanks

## 2017-01-11 NOTE — Telephone Encounter (Signed)
Sent in one refill. Left VM advising Pt of refill and appt reminder.

## 2017-01-14 ENCOUNTER — Ambulatory Visit (INDEPENDENT_AMBULATORY_CARE_PROVIDER_SITE_OTHER): Payer: No Typology Code available for payment source | Admitting: Physician Assistant

## 2017-01-14 ENCOUNTER — Encounter: Payer: Self-pay | Admitting: Physician Assistant

## 2017-01-14 VITALS — BP 115/72 | HR 66 | Wt 221.0 lb

## 2017-01-14 DIAGNOSIS — Z131 Encounter for screening for diabetes mellitus: Secondary | ICD-10-CM | POA: Diagnosis not present

## 2017-01-14 DIAGNOSIS — Z0000 Encounter for general adult medical examination without abnormal findings: Secondary | ICD-10-CM | POA: Diagnosis not present

## 2017-01-14 DIAGNOSIS — I10 Essential (primary) hypertension: Secondary | ICD-10-CM | POA: Diagnosis not present

## 2017-01-14 DIAGNOSIS — Z13220 Encounter for screening for lipoid disorders: Secondary | ICD-10-CM | POA: Diagnosis not present

## 2017-01-14 DIAGNOSIS — Z130 Encounter for screening for diseases of the blood and blood-forming organs and certain disorders involving the immune mechanism: Secondary | ICD-10-CM | POA: Diagnosis not present

## 2017-01-14 LAB — CBC
HCT: 44.8 % (ref 38.5–50.0)
HEMOGLOBIN: 15.6 g/dL (ref 13.2–17.1)
MCH: 29.9 pg (ref 27.0–33.0)
MCHC: 34.8 g/dL (ref 32.0–36.0)
MCV: 86 fL (ref 80.0–100.0)
MPV: 11.6 fL (ref 7.5–12.5)
Platelets: 218 10*3/uL (ref 140–400)
RBC: 5.21 10*6/uL (ref 4.20–5.80)
RDW: 12.1 % (ref 11.0–15.0)
WBC: 4.9 10*3/uL (ref 3.8–10.8)

## 2017-01-14 LAB — COMPREHENSIVE METABOLIC PANEL
AG Ratio: 1.6 (calc) (ref 1.0–2.5)
ALBUMIN MSPROF: 4.7 g/dL (ref 3.6–5.1)
ALKALINE PHOSPHATASE (APISO): 70 U/L (ref 40–115)
ALT: 15 U/L (ref 9–46)
AST: 17 U/L (ref 10–40)
BILIRUBIN TOTAL: 0.6 mg/dL (ref 0.2–1.2)
BUN: 14 mg/dL (ref 7–25)
CALCIUM: 9.4 mg/dL (ref 8.6–10.3)
CO2: 29 mmol/L (ref 20–32)
Chloride: 102 mmol/L (ref 98–110)
Creat: 1.14 mg/dL (ref 0.60–1.35)
Globulin: 3 g/dL (calc) (ref 1.9–3.7)
Glucose, Bld: 88 mg/dL (ref 65–99)
POTASSIUM: 4.5 mmol/L (ref 3.5–5.3)
Sodium: 138 mmol/L (ref 135–146)
Total Protein: 7.7 g/dL (ref 6.1–8.1)

## 2017-01-14 LAB — LIPID PANEL W/REFLEX DIRECT LDL
CHOLESTEROL: 195 mg/dL (ref <200)
HDL: 52 mg/dL (ref >40)
LDL Cholesterol (Calc): 123 mg/dL (calc) — ABNORMAL HIGH
Non-HDL Cholesterol (Calc): 143 mg/dL (calc) — ABNORMAL HIGH (ref <130)
Total CHOL/HDL Ratio: 3.8 (calc) (ref <5.0)
Triglycerides: 96 mg/dL (ref <150)

## 2017-01-14 MED ORDER — AMLODIPINE BESYLATE 5 MG PO TABS: 5.0000 mg | ORAL_TABLET | Freq: Every day | ORAL | 1 refills | Status: DC

## 2017-01-14 NOTE — Progress Notes (Signed)
HPI:                                                                Charles Estrada is a 27 y.o. male who presents to [facility]: Primary Care Sports Medicine today for annual physical exam  No concerns today.  HTN: taking Amlodipine daily. Compliant with medications. Checks BP's at home. BP range 120's/70's. Denies vision change, headache, chest pain with exertion, orthopnea, lightheadedness, syncope and edema.    Depression screen PHQ 2/9 03/13/2016  Decreased Interest 0  Down, Depressed, Hopeless 0  PHQ - 2 Score 0     Past Medical History:  Diagnosis Date  . Seasonal affective disorder in remission [facility])    Past Surgical History:  Procedure Laterality Date  . ACHILLES TENDON SURGERY  01/31/2011   Procedure: ACHILLES TENDON REPAIR;  Surgeon: Nadara Mustard, MD;  Location: [facility] OR;  Service: Orthopedics;  Laterality: Right;   Social History   Tobacco Use  . Smoking status: Never Smoker  . Smokeless tobacco: Never Used  Substance Use Topics  . Alcohol use: Yes    Alcohol/week: 6.0 oz    Types: 10 Cans of beer per week   family history includes Hypertension in his brother and father.  ROS: negative except as noted in the HPI  Medications: Current Outpatient Medications  Medication Sig Dispense Refill  . amLODipine (NORVASC) 5 MG tablet Take 1 tablet (5 mg total) by mouth daily. 90 tablet 1   No current facility-administered medications for this visit.    No Known Allergies     Objective:  BP 115/72   Pulse 66   Wt 221 lb (100.2 kg)   SpO2 99%   BMI 25.54 kg/m  General Appearance:  Alert, cooperative, no distress, appropriate for age, well-appearing male                            Head:  Normocephalic, without obvious abnormality                             Eyes:  PERRL, EOM's intact, conjunctiva and cornea clear                             Ears:  TM pearly gray color and semitransparent, external ear canals normal, both ears               Nose:  Nares symmetrical                          Throat:  Lips, tongue, and mucosa are moist, pink, and intact; good dentition                             Neck:  Supple; symmetrical, trachea midline, no adenopathy; thyroid: no enlargement, symmetric, no tenderness/mass/nodules                             Back:  Symmetrical, no curvature, ROM normal  Lungs:  Clear to auscultation bilaterally, respirations unlabored                             Heart:  regular rate & normal rhythm, S1 and S2 normal, no murmurs, rubs, or gallops, no carotid bruit                     Abdomen:  Soft, non-tender, no mass or organomegaly              Genitourinary:  deferred         Musculoskeletal:  Tone and strength strong and symmetrical, all extremities; no joint pain or edema, normal gait and station, negative Romberg                                       Lymphatic:  No adenopathy             Skin/Hair/Nails:  Skin warm, dry and intact, no rashes or abnormal dyspigmentation on limited exam                   Neurologic:  Alert and oriented x3, no cranial nerve deficits, DTR's intact, sensation grossly intact, normal gait and station, no tremor Psych: well-groomed, cooperative, good eye contact, euthymic mood, affect mood-congruent, speech is articulate, and thought processes clear and goal-directed     No results found for this or any previous visit (from the past 72 hour(s)). No results found.    Assessment and Plan: 27 y.o. male with   1. Encounter for annual physical exam - routine fasting labs - declines STI screening - immunizations UTD - Comprehensive metabolic panel - CBC - Lipid Panel w/reflex Direct LDL  2. Essential hypertension BP Readings from Last 3 Encounters:  01/14/17 115/72  04/24/16 140/63  04/10/16 138/86  - BP in range - continue CCB and therapeutic lifestyle changes. Follow-up Q526months - amLODipine (NORVASC) 5 MG tablet; Take 1 tablet  (5 mg total) by mouth daily.  Dispense: 90 tablet; Refill: 1  3. Screening for diabetes mellitus - Comprehensive metabolic panel  4. Screening for blood disease - Comprehensive metabolic panel - CBC  5. Screening for lipid disorders - Lipid Panel w/reflex Direct LDL   Patient education and anticipatory guidance given Patient agrees with treatment plan Follow-up in 6 months for medication management or sooner as needed   Levonne Hubertharley E. Breanna Shorkey PA-C

## 2017-01-14 NOTE — Patient Instructions (Signed)
For your blood pressure: - Goal <=130/80 - Continue Amlodipine daily - Follow DASH eating plan - limit alcohol to 2 standard drinks per day - avoid tobacco products   Physical Activity Recommendations for modifying lipids and lowering blood pressure Engage in aerobic physical activity to reduce LDL-cholesterol, non-HDL-cholesterol, and blood pressure  Frequency: 3-4 sessions per week  Intensity: moderate to vigorous  Duration: 40 minutes on average  Physical Activity Recommendations for secondary prevention 1. Aerobic exercise  Frequency: 3-5 sessions per week  Intensity: 50-80% capacity  Duration: 20 - 60 minutes  Examples: walking, treadmill, cycling, rowing, stair climbing, and arm/leg ergometry  2. Resistance exercise  Frequency: 2-3 sessions per week  Intensity: 10-15 repetitions/set to moderate fatigue  Duration: 1-3 sets of 8-10 upper and lower body exercises  Examples: calisthenics, elastic bands, cuff/hand weights, dumbbels, free weights, wall pulleys, and weight machines  Heart-Healthy Lifestyle  Eating a diet rich in vegetables, fruits and whole grains: also includes low-fat dairy products, poultry, fish, legumes, and nuts; limit intake of sweets, sugar-sweetened beverages and red meats  Getting regular exercise  Maintaining a healthy weight  Not smoking or getting help quitting  Staying on top of your health; for some people, lifestyle changes alone may not be enough to prevent a heart attack or stroke. In these cases, taking a statin at the right dose will most likely be necessary

## 2017-01-15 NOTE — Progress Notes (Signed)
Hi Garth,  Your labs look great!  - normal kidney function - cholesterol in a healthy range - normal blood counts - no evidence of diabetes  Best, Vinetta Bergamoharley

## 2017-02-15 MED FILL — AMLODIPINE BESYLATE 5 MG TA: 5 | 90 days supply | Qty: 90 | Fill #0

## 2017-05-18 MED FILL — AMLODIPINE BESYLATE 5 MG TA: 5 | 90 days supply | Qty: 90 | Fill #1

## 2017-08-23 ENCOUNTER — Other Ambulatory Visit: Payer: Self-pay | Admitting: Physician Assistant

## 2017-08-23 DIAGNOSIS — I10 Essential (primary) hypertension: Secondary | ICD-10-CM

## 2017-08-24 MED FILL — AMLODIPINE BESYLATE 5 MG TA: 5 | 30 days supply | Qty: 30 | Fill #0

## 2017-09-23 ENCOUNTER — Other Ambulatory Visit: Payer: Self-pay | Admitting: Physician Assistant

## 2017-09-23 DIAGNOSIS — I10 Essential (primary) hypertension: Secondary | ICD-10-CM

## 2017-09-24 MED FILL — AMLODIPINE BESYLATE 5 MG TA: 5 | 15 days supply | Qty: 15 | Fill #0

## 2017-10-20 ENCOUNTER — Ambulatory Visit (INDEPENDENT_AMBULATORY_CARE_PROVIDER_SITE_OTHER): Payer: No Typology Code available for payment source | Admitting: Physician Assistant

## 2017-10-20 ENCOUNTER — Encounter: Payer: Self-pay | Admitting: Physician Assistant

## 2017-10-20 VITALS — BP 154/90 | HR 67 | Wt 214.0 lb

## 2017-10-20 DIAGNOSIS — Z5181 Encounter for therapeutic drug level monitoring: Secondary | ICD-10-CM | POA: Diagnosis not present

## 2017-10-20 DIAGNOSIS — I10 Essential (primary) hypertension: Secondary | ICD-10-CM | POA: Diagnosis not present

## 2017-10-20 MED ORDER — AMLODIPINE BESYLATE 5 MG PO TABS: 5.0000 mg | ORAL_TABLET | Freq: Every day | ORAL | 3 refills | Status: DC

## 2017-10-20 MED FILL — AMLODIPINE BESYLATE 5 MG TA: 5 | 90 days supply | Qty: 90 | Fill #0

## 2017-10-20 NOTE — Patient Instructions (Signed)
For your blood pressure: - Goal <130/80 - monitor and log blood pressures at home - check around the same time each day in a relaxed setting - Limit salt to <2000 mg/day - Follow DASH eating plan - limit alcohol to 2 standard drinks per day for men and 1 per day for women - avoid tobacco products - get at least 2 hours of regular aerobic exercise   Managing Your Hypertension Hypertension is commonly called high blood pressure. This is when the force of your blood pressing against the walls of your arteries is too strong. Arteries are blood vessels that carry blood from your heart throughout your body. Hypertension forces the heart to work harder to pump blood, and may cause the arteries to become narrow or stiff. Having untreated or uncontrolled hypertension can cause heart attack, stroke, kidney disease, and other problems. What are blood pressure readings? A blood pressure reading consists of a higher number over a lower number. Ideally, your blood pressure should be below 120/80. The first ("top") number is called the systolic pressure. It is a measure of the pressure in your arteries as your heart beats. The second ("bottom") number is called the diastolic pressure. It is a measure of the pressure in your arteries as the heart relaxes. What does my blood pressure reading mean? Blood pressure is classified into four stages. Based on your blood pressure reading, your health care provider may use the following stages to determine what type of treatment you need, if any. Systolic pressure and diastolic pressure are measured in a unit called mm Hg. Normal  Systolic pressure: below 120.  Diastolic pressure: below 80. Elevated  Systolic pressure: 120-129.  Diastolic pressure: below 80. Hypertension stage 1  Systolic pressure: 130-139.  Diastolic pressure: 80-89. Hypertension stage 2  Systolic pressure: 140 or above.  Diastolic pressure: 90 or above. What health risks are associated  with hypertension? Managing your hypertension is an important responsibility. Uncontrolled hypertension can lead to:  A heart attack.  A stroke.  A weakened blood vessel (aneurysm).  Heart failure.  Kidney damage.  Eye damage.  Metabolic syndrome.  Memory and concentration problems.  What changes can I make to manage my hypertension? Hypertension can be managed by making lifestyle changes and possibly by taking medicines. Your health care provider will help you make a plan to bring your blood pressure within a normal range. Eating and drinking  Eat a diet that is high in fiber and potassium, and low in salt (sodium), added sugar, and fat. An example eating plan is called the DASH (Dietary Approaches to Stop Hypertension) diet. To eat this way: ? Eat plenty of fresh fruits and vegetables. Try to fill half of your plate at each meal with fruits and vegetables. ? Eat whole grains, such as whole wheat pasta, brown rice, or whole grain bread. Fill about one quarter of your plate with whole grains. ? Eat low-fat diary products. ? Avoid fatty cuts of meat, processed or cured meats, and poultry with skin. Fill about one quarter of your plate with lean proteins such as fish, chicken without skin, beans, eggs, and tofu. ? Avoid premade and processed foods. These tend to be higher in sodium, added sugar, and fat.  Reduce your daily sodium intake. Most people with hypertension should eat less than 1,500 mg of sodium a day.  Limit alcohol intake to no more than 1 drink a day for nonpregnant women and 2 drinks a day for men. One drink equals 12  oz of beer, 5 oz of wine, or 1 oz of hard liquor. Lifestyle  Work with your health care provider to maintain a healthy body weight, or to lose weight. Ask what an ideal weight is for you.  Get at least 30 minutes of exercise that causes your heart to beat faster (aerobic exercise) most days of the week. Activities may include walking, swimming, or  biking.  Include exercise to strengthen your muscles (resistance exercise), such as weight lifting, as part of your weekly exercise routine. Try to do these types of exercises for 30 minutes at least 3 days a week.  Do not use any products that contain nicotine or tobacco, such as cigarettes and e-cigarettes. If you need help quitting, ask your health care provider.  Control any long-term (chronic) conditions you have, such as high cholesterol or diabetes. Monitoring  Monitor your blood pressure at home as told by your health care provider. Your personal target blood pressure may vary depending on your medical conditions, your age, and other factors.  Have your blood pressure checked regularly, as often as told by your health care provider. Working with your health care provider  Review all the medicines you take with your health care provider because there may be side effects or interactions.  Talk with your health care provider about your diet, exercise habits, and other lifestyle factors that may be contributing to hypertension.  Visit your health care provider regularly. Your health care provider can help you create and adjust your plan for managing hypertension. Will I need medicine to control my blood pressure? Your health care provider may prescribe medicine if lifestyle changes are not enough to get your blood pressure under control, and if:  Your systolic blood pressure is 130 or higher.  Your diastolic blood pressure is 80 or higher.  Take medicines only as told by your health care provider. Follow the directions carefully. Blood pressure medicines must be taken as prescribed. The medicine does not work as well when you skip doses. Skipping doses also puts you at risk for problems. Contact a health care provider if:  You think you are having a reaction to medicines you have taken.  You have repeated (recurrent) headaches.  You feel dizzy.  You have swelling in your  ankles.  You have trouble with your vision. Get help right away if:  You develop a severe headache or confusion.  You have unusual weakness or numbness, or you feel faint.  You have severe pain in your chest or abdomen.  You vomit repeatedly.  You have trouble breathing. Summary  Hypertension is when the force of blood pumping through your arteries is too strong. If this condition is not controlled, it may put you at risk for serious complications.  Your personal target blood pressure may vary depending on your medical conditions, your age, and other factors. For most people, a normal blood pressure is less than 120/80.  Hypertension is managed by lifestyle changes, medicines, or both. Lifestyle changes include weight loss, eating a healthy, low-sodium diet, exercising more, and limiting alcohol. This information is not intended to replace advice given to you by your health care provider. Make sure you discuss any questions you have with your health care provider. Document Released: 09/16/2011 Document Revised: 11/20/2015 Document Reviewed: 11/20/2015 Elsevier Interactive Patient Education  [employer]2018 [employer] Inc[employer]

## 2017-10-20 NOTE — Progress Notes (Signed)
HPI:                                                                Charles Estrada is a 27 y.o. male who presents to [facility]: Primary Care Sports Medicine today for HTN follow-up  HTN: taking Amlodipine 5mg  daily. Compliant with medications. He ran out of his medication 1 week ago. Checks BP's at home. BP range 120-133/66-80. He has noted some mild bilateral ankle swelling. Denies vision change, headache, chest pain with exertion, orthopnea, lightheadedness, syncope and edema. Risk factors include: male sex  He is under increased stress, has been separated from his wife for 10 months.     Past Medical History:  Diagnosis Date  . Seasonal affective disorder in remission [facility])    Past Surgical History:  Procedure Laterality Date  . ACHILLES TENDON SURGERY  01/31/2011   Procedure: ACHILLES TENDON REPAIR;  Surgeon: Nadara Mustard, MD;  Location: [facility] OR;  Service: Orthopedics;  Laterality: Right;   Social History   Tobacco Use  . Smoking status: Never Smoker  . Smokeless tobacco: Never Used  Substance Use Topics  . Alcohol use: Yes    Alcohol/week: 10.0 standard drinks    Types: 10 Cans of beer per week   family history includes Hypertension in his brother and father.    ROS: negative except as noted in the HPI  Medications: Current Outpatient Medications  Medication Sig Dispense Refill  . amLODipine (NORVASC) 5 MG tablet Take 1 tablet (5 mg total) by mouth daily. 90 tablet 3   No current facility-administered medications for this visit.    No Known Allergies     Objective:  BP (!) 154/90   Pulse 67   Wt 214 lb (97.1 kg)   BMI 24.73 kg/m  Gen:  alert, not ill-appearing, no distress, appropriate for age HEENT: head normocephalic without obvious abnormality, conjunctiva and cornea clear, trachea midline Pulm: Normal work of breathing, normal phonation, clear to auscultation bilaterally, no wheezes, rales or rhonchi CV: Normal rate, regular  rhythm, s1 and s2 distinct, no murmurs, clicks or rubs  Neuro: alert and oriented x 3, no tremor MSK: extremities atraumatic, normal gait and station, no peripheral edema Skin: intact, no rashes on exposed skin, no jaundice, no cyanosis Psych: well-groomed, cooperative, good eye contact, euthymic mood, affect mood-congruent, speech is articulate, and thought processes clear and goal-directed  Lab Results  Component Value Date   CREATININE 1.14 01/14/2017   BUN 14 01/14/2017   NA 138 01/14/2017   K 4.5 01/14/2017   CL 102 01/14/2017   CO2 29 01/14/2017     No results found for this or any previous visit (from the past 72 hour(s)). No results found.    Assessment and Plan: 27 y.o. male with   .Ashtin was seen today for medication refill.  Diagnoses and all orders for this visit:  Encounter for medication monitoring -     Basic metabolic panel -     Urinalysis, Routine w reflex microscopic  Essential hypertension -     amLODipine (NORVASC) 5 MG tablet; Take 1 tablet (5 mg total) by mouth daily. -     Basic metabolic panel -     Urinalysis, Routine w reflex microscopic  BP out of range today, ran out of medication 1 week ago. Home readings are in range. Reassured that mild peripheral edema is a common side effect. This is not overly bothersome to him.  Cont Amlodipine 5 mg daily Counseled on therapeutic lifestyle changes   Patient education and anticipatory guidance given Patient agrees with treatment plan Follow-up in 1 year or sooner as needed if symptoms worsen or fail to improve  Charles Hubert PA-C

## 2017-10-21 LAB — URINALYSIS, ROUTINE W REFLEX MICROSCOPIC
BILIRUBIN URINE: NEGATIVE
GLUCOSE, UA: NEGATIVE
Hgb urine dipstick: NEGATIVE
Ketones, ur: NEGATIVE
Leukocytes, UA: NEGATIVE
Nitrite: NEGATIVE
PROTEIN: NEGATIVE
Specific Gravity, Urine: 1.008 (ref 1.001–1.03)
pH: 6.5 (ref 5.0–8.0)

## 2017-10-21 LAB — BASIC METABOLIC PANEL
BUN: 20 mg/dL (ref 7–25)
CO2: 26 mmol/L (ref 20–32)
CREATININE: 1.03 mg/dL (ref 0.60–1.35)
Calcium: 9.6 mg/dL (ref 8.6–10.3)
Chloride: 103 mmol/L (ref 98–110)
GLUCOSE: 99 mg/dL (ref 65–99)
Potassium: 4.3 mmol/L (ref 3.5–5.3)
Sodium: 138 mmol/L (ref 135–146)

## 2018-01-24 MED FILL — AMLODIPINE BESYLATE 5 MG TA: 5 | 90 days supply | Qty: 90 | Fill #1

## 2018-05-16 MED FILL — AMLODIPINE BESYLATE 5 MG TA: 5 | 90 days supply | Qty: 90 | Fill #2

## 2018-06-13 ENCOUNTER — Telehealth: Payer: Self-pay | Admitting: Physician Assistant

## 2018-06-13 NOTE — Telephone Encounter (Signed)
In office?

## 2018-06-13 NOTE — Telephone Encounter (Signed)
Would you like an e-visit or in office? Please advise. -EH/RMA

## 2018-06-13 NOTE — Telephone Encounter (Signed)
Scheduled for 6/9 -EH/RMA

## 2018-06-13 NOTE — Telephone Encounter (Signed)
Thank you :)

## 2018-06-13 NOTE — Telephone Encounter (Signed)
Chest Pain for about two months. Initially thought I pulled a muscle, then thought maybe it was posture related. Still continuing, I have a little tightness breathing wise but continue to workout and I have not had a fever or any other symptoms related to covid. My Blood pressure machine has referenced irregular heartbeat.

## 2018-06-14 ENCOUNTER — Other Ambulatory Visit: Payer: Self-pay

## 2018-06-14 ENCOUNTER — Ambulatory Visit (INDEPENDENT_AMBULATORY_CARE_PROVIDER_SITE_OTHER): Payer: No Typology Code available for payment source

## 2018-06-14 ENCOUNTER — Encounter: Payer: Self-pay | Admitting: Physician Assistant

## 2018-06-14 ENCOUNTER — Ambulatory Visit (INDEPENDENT_AMBULATORY_CARE_PROVIDER_SITE_OTHER): Payer: No Typology Code available for payment source | Admitting: Physician Assistant

## 2018-06-14 VITALS — BP 136/85 | HR 79 | Temp 98.5°F | Wt 213.0 lb

## 2018-06-14 DIAGNOSIS — R0789 Other chest pain: Secondary | ICD-10-CM | POA: Diagnosis not present

## 2018-06-14 DIAGNOSIS — R0602 Shortness of breath: Secondary | ICD-10-CM | POA: Diagnosis not present

## 2018-06-14 DIAGNOSIS — Z13220 Encounter for screening for lipoid disorders: Secondary | ICD-10-CM | POA: Diagnosis not present

## 2018-06-14 DIAGNOSIS — I10 Essential (primary) hypertension: Secondary | ICD-10-CM | POA: Diagnosis not present

## 2018-06-14 DIAGNOSIS — R9431 Abnormal electrocardiogram [ECG] [EKG]: Secondary | ICD-10-CM

## 2018-06-14 DIAGNOSIS — R7989 Other specified abnormal findings of blood chemistry: Secondary | ICD-10-CM

## 2018-06-14 NOTE — Patient Instructions (Signed)

## 2018-06-14 NOTE — Progress Notes (Signed)
HPI:                                                                Charles Estrada is a 28 y.o. male who [facility]: Primary Care Sports Medicine today for chest pain  Chest Pain   This is a new problem. The current episode started more than 1 month ago. The onset quality is undetermined. The problem occurs constantly. The problem has been unchanged. The pain is present in the lateral region (right). The pain is mild. The quality of the pain is described as dull (achey). The pain does not radiate. Associated symptoms include irregular heartbeat and shortness of breath. Pertinent negatives include no abdominal pain, cough, dizziness, exertional chest pressure, lower extremity edema, malaise/fatigue, nausea, near-syncope, orthopnea, palpitations, PND, syncope or weakness. The pain is aggravated by nothing. He has tried nothing for the symptoms. Risk factors include male gender and smoking/tobacco exposure.  His past medical history is significant for hypertension.  His family medical history is significant for CAD and hypertension.  Pertinent negatives for family medical history include: no sudden death.  Reports his home BP machine has told him that his heartbeat is irregular at times. Reports home BP range is 120-140's/70's-80's. Reports BP improves with rest and recheck.    Past Medical History:  Diagnosis Date  . Seasonal affective disorder in remission [facility](HCC)    Past Surgical History:  Procedure Laterality Date  . ACHILLES TENDON SURGERY  01/31/2011   Procedure: ACHILLES TENDON REPAIR;  Surgeon: Nadara MustardMarcus V Duda, MD;  Location: [facility] OR;  Service: Orthopedics;  Laterality: Right;   Social History   Tobacco Use  . Smoking status: Never Smoker  . Smokeless tobacco: Never Used  Substance Use Topics  . Alcohol use: Yes    Alcohol/week: 10.0 standard drinks    Types: 10 Cans of beer per week   family history includes Hypertension in his brother and  father.    ROS: negative except as noted in the HPI  Medications: Current Outpatient Medications  Medication Sig Dispense Refill  . amLODipine (NORVASC) 5 MG tablet Take 1 tablet (5 mg total) by mouth daily. 90 tablet 3   No current facility-administered medications for this visit.    No Known Allergies     Objective:  BP 136/85   Pulse 79   Temp 98.5 F (36.9 C) (Oral)   Wt 213 lb (96.6 kg)   SpO2 98%   BMI 24.61 kg/m  Gen:  alert, not ill-appearing, no distress, appropriate for age HEENT: head normocephalic without obvious abnormality, conjunctiva and cornea clear, trachea midline, no carotid bruit Pulm: Normal work of breathing, normal phonation, clear to auscultation bilaterally, no wheezes, rales or rhonchi CV: Normal rate, regular rhythm, s1 and s2 distinct, no murmurs, clicks or rubs  Neuro: alert and oriented x 3, no tremor MSK: extremities atraumatic, normal gait and station, no peripheral edema Skin: intact, no rashes on exposed skin, no jaundice, no cyanosis Psych: well-groomed, cooperative, good eye contact, euthymic mood, affect mood-congruent, speech is articulate, and thought processes clear and goal-directed  ECG 06/14/2018 Vent rate 75 bpm PR-I 158 ms QRS 94 ms QT/QTc 382/426 ms NSR, T wave abnormality  BP Readings from Last 3 Encounters:  06/14/18  136/85  10/20/17 (!) 154/90  01/14/17 115/72   Wt Readings from Last 3 Encounters:  06/14/18 213 lb (96.6 kg)  10/20/17 214 lb (97.1 kg)  01/14/17 221 lb (100.2 kg)    Assessment and Plan: 28 y.o. male with   .Charles Estrada was seen today for chest pain.  Diagnoses and all orders for this visit:  Atypical chest pain -     DG Chest 2 View -     EKG 12-Lead -     D-dimer, quantitative (not at [facility]) -     CBC with Differential/Platelet -     EXERCISE TOLERANCE TEST (ETT); Future -     EXERCISE TOLERANCE TEST (ETT)  Shortness of breath -     DG Chest 2 View -     EKG 12-Lead -     D-dimer,  quantitative (not at [facility]) -     CBC with Differential/Platelet  Essential hypertension -     COMPLETE METABOLIC PANEL WITH GFR -     TSH + free T4  Screening for lipid disorders -     Lipid Panel w/reflex Direct LDL  Elevated TSH -     Thyroid Panel With TSH; Future -     Thyroid Panel With TSH  Other orders -     T4, free     Atypical chest pain Non-exertional, non-reproducible right-sided CP described as dull/achey persistent for the last 6-8 weeks. Occ associated with mild SOB and irregular heartbeat ECG personally reviewed by me and supervising physican, Dr. Emeterio Reeve Nonspecific T wave abnormality noted BP in stage 1 hypertensive range in office. Home readings are mostly in range. Consider increasing Amlodipine to 10 mg. Would like him to log BP's athome CBC, CMP, TSH, d-dimer and CXR pending CVD risk factors: male sex, HTN, family hx of CAD Exercise stress test pending  Patient education and anticipatory guidance given Patient agrees with treatment plan Follow-up pending diagnostic test results or sooner as needed if symptoms worsen or fail to improve  Charles Russian PA-C

## 2018-06-16 LAB — LIPID PANEL W/REFLEX DIRECT LDL
Cholesterol: 198 mg/dL (ref <200)
HDL: 72 mg/dL (ref >=40)
LDL Cholesterol (Calc): 111 mg/dL (calc) — ABNORMAL HIGH
Non-HDL Cholesterol (Calc): 126 mg/dL (calc) (ref <130)
Total CHOL/HDL Ratio: 2.8 (calc) (ref <5.0)
Triglycerides: 61 mg/dL (ref <150)

## 2018-06-17 LAB — COMPLETE METABOLIC PANEL WITH GFR
AG Ratio: 2 (calc) (ref 1.0–2.5)
ALT: 22 U/L (ref 9–46)
AST: 25 U/L (ref 10–40)
Albumin: 4.8 g/dL (ref 3.6–5.1)
Alkaline phosphatase (APISO): 62 U/L (ref 36–130)
BUN: 16 mg/dL (ref 7–25)
CO2: 30 mmol/L (ref 20–32)
Calcium: 9.6 mg/dL (ref 8.6–10.3)
Chloride: 102 mmol/L (ref 98–110)
Creat: 1.07 mg/dL (ref 0.60–1.35)
GFR, Est African American: 108 mL/min/{1.73_m2} (ref >=60)
GFR, Est Non African American: 93 mL/min/{1.73_m2} (ref >=60)
Globulin: 2.4 g/dL (calc) (ref 1.9–3.7)
Glucose, Bld: 113 mg/dL — ABNORMAL HIGH (ref 65–99)
Potassium: 4.4 mmol/L (ref 3.5–5.3)
Sodium: 138 mmol/L (ref 135–146)
Total Bilirubin: 0.9 mg/dL (ref 0.2–1.2)
Total Protein: 7.2 g/dL (ref 6.1–8.1)

## 2018-06-17 LAB — CBC WITH DIFFERENTIAL/PLATELET
Absolute Monocytes: 422 cells/uL (ref 200–950)
Basophils Absolute: 40 cells/uL (ref 0–200)
Basophils Relative: 0.9 %
Eosinophils Absolute: 92 cells/uL (ref 15–500)
Eosinophils Relative: 2.1 %
HCT: 43.6 % (ref 38.5–50.0)
Hemoglobin: 14.9 g/dL (ref 13.2–17.1)
Lymphs Abs: 1492 cells/uL (ref 850–3900)
MCH: 30.2 pg (ref 27.0–33.0)
MCHC: 34.2 g/dL (ref 32.0–36.0)
MCV: 88.4 fL (ref 80.0–100.0)
MPV: 11.1 fL (ref 7.5–12.5)
Monocytes Relative: 9.6 %
Neutro Abs: 2354 cells/uL (ref 1500–7800)
Neutrophils Relative %: 53.5 %
Platelets: 208 10*3/uL (ref 140–400)
RBC: 4.93 10*6/uL (ref 4.20–5.80)
RDW: 12.3 % (ref 11.0–15.0)
Total Lymphocyte: 33.9 %
WBC: 4.4 10*3/uL (ref 3.8–10.8)

## 2018-06-17 LAB — TSH+FREE T4: TSH W/REFLEX TO FT4: 4.6 mIU/L — ABNORMAL HIGH (ref 0.40–4.50)

## 2018-06-17 LAB — D-DIMER, QUANTITATIVE: D-Dimer, Quant: <0.19 mcg/mL FEU (ref <0.50)

## 2018-06-17 LAB — T4, FREE: Free T4: 1.2 ng/dL (ref 0.8–1.8)

## 2018-06-20 ENCOUNTER — Other Ambulatory Visit: Payer: Self-pay

## 2018-06-27 ENCOUNTER — Encounter: Payer: Self-pay | Admitting: Physician Assistant

## 2018-06-27 DIAGNOSIS — R7989 Other specified abnormal findings of blood chemistry: Secondary | ICD-10-CM | POA: Insufficient documentation

## 2018-06-27 DIAGNOSIS — R9431 Abnormal electrocardiogram [ECG] [EKG]: Secondary | ICD-10-CM | POA: Insufficient documentation

## 2018-06-28 ENCOUNTER — Encounter: Payer: Self-pay | Admitting: Physician Assistant

## 2018-06-28 ENCOUNTER — Ambulatory Visit (INDEPENDENT_AMBULATORY_CARE_PROVIDER_SITE_OTHER): Payer: No Typology Code available for payment source | Admitting: Physician Assistant

## 2018-06-28 VITALS — BP 139/84 | HR 73 | Temp 98.1°F | Wt 216.0 lb

## 2018-06-28 DIAGNOSIS — I10 Essential (primary) hypertension: Secondary | ICD-10-CM

## 2018-06-28 DIAGNOSIS — Z8349 Family history of other endocrine, nutritional and metabolic diseases: Secondary | ICD-10-CM | POA: Diagnosis not present

## 2018-06-28 DIAGNOSIS — R0789 Other chest pain: Secondary | ICD-10-CM | POA: Diagnosis not present

## 2018-06-28 DIAGNOSIS — R7989 Other specified abnormal findings of blood chemistry: Secondary | ICD-10-CM

## 2018-06-28 NOTE — Progress Notes (Signed)
HPI:                                                                Charles Estrada is a 28 y.o. male who presents to [place]: [place] today for chest pain follow-up  Chest Pain  This is a new problem. The current episode started more than 1 month ago. The onset quality is undetermined. The problem occurs constantly. The problem has been unchanged. The pain is present in the lateral region (right). The pain is mild. The quality of the pain is described as dull (achey). The pain does not radiate. Associated symptoms include irregular heartbeat and shortness of breath. Pertinent negatives include no abdominal pain, cough, dizziness, exertional chest pressure, lower extremity edema, malaise/fatigue, nausea, near-syncope, orthopnea, palpitations, PND, syncope or weakness. The pain is aggravated by nothing. He has tried nothing for the symptoms. Risk factors include male gender and smoking/tobacco exposure.  His past medical history is significant for hypertension.  His family medical history is significant for CAD and hypertension.  Pertinent negatives for family medical history include: no sudden death.  Reports his home BP machine has told him that his heartbeat is irregular at times. Reports home BP range is 120-140's/70's-80's. Reports BP improves with rest and recheck.    Work-up: On laboratory work-up patient was found to have a slightly abnormal TSH of 4.6 with normal T4. Patient reports today strong family history of thyroid disease in the women on paternal side.  He thinks that his hyperthyroidism but he is not really sure.  Denies family history of thyroid cancers. Chest x-ray was negative.  CBC, CMP, d-dimer unremarkable. He has not been contacted to schedule his stress test.  We have looked into this and the department states that he will need to have a negative COVID test and that they will reach out to him regarding their process.  Past  Medical History:  Diagnosis Date  . Seasonal affective disorder in remission [facility])    Past Surgical History:  Procedure Laterality Date  . ACHILLES TENDON SURGERY  01/31/2011   Procedure: ACHILLES TENDON REPAIR;  Surgeon: Newt Minion, MD;  Location: [facility];  Service: Orthopedics;  Laterality: Right;   Social History   Tobacco Use  . Smoking status: Never Smoker  . Smokeless tobacco: Never Used  Substance Use Topics  . Alcohol use: Yes    Alcohol/week: 10.0 standard drinks    Types: 10 Cans of beer per week   family history includes Hypertension in his brother and father.    ROS: negative except as noted in the HPI  Medications: Current Outpatient Medications  Medication Sig Dispense Refill  . amLODipine (NORVASC) 5 MG tablet Take 1 tablet (5 mg total) by mouth daily. 90 tablet 3   No current facility-administered medications for this visit.    No Known Allergies     Objective:  BP 139/84   Pulse 73   Temp 98.1 F (36.7 C) (Oral)   Wt 216 lb (98 kg)   BMI 24.96 kg/m  Gen:  alert, not ill-appearing, no distress, appropriate for age HEENT: head normocephalic without obvious abnormality, conjunctiva and cornea clear, trachea midline, thyroid nontender, no enlargement, no palpable nodules Pulm: Normal work of breathing,  normal phonation Neuro: alert and oriented x 3, no tremor MSK: extremities atraumatic, normal gait and station, no peripheral edema Skin: intact, no rashes on exposed skin, no jaundice, no cyanosis Psych: well-groomed, cooperative, good eye contact, euthymic mood, affect mood-congruent, speech is articulate, and thought processes clear and goal-directed  ECG 06/14/2018 Vent rate 75 bpm PR-I 158 ms QRS 94 ms QT/QTc 382/426 ms NSR, T wave abnormality  BP Readings from Last 3 Encounters:  06/28/18 139/84  06/14/18 136/85  10/20/17 (!) 154/90   Wt Readings from Last 3 Encounters:  06/28/18 216 lb (98 kg)  06/14/18 213 lb (96.6 kg)  10/20/17  214 lb (97.1 kg)    Assessment and Plan: 28 y.o. male with   .Ladona Ridgelaylor was seen today for follow-up.  Diagnoses and all orders for this visit:  Elevated TSH -     Thyroid peroxidase antibody  Family history of thyroid disease Comments: multiple male relatives on paternal side  Atypical chest pain     Atypical chest pain Non-exertional, non-reproducible right-sided CP described as dull/achey persistent for the last 6-8 weeks. Occ associated with mild SOB and irregular heartbeat ECG personally reviewed by me and supervising physican, Dr. Sunnie NielsenNatalie Alexander Nonspecific T wave abnormality was noted on ECG on 06/14/18 - patient was referred for stress test Exercise stress test pending -contacted cardiology department today and they will reach out to patient to schedule COVID testing BP in stage 1 hypertensive range in office. Home readings are mostly in range. Consider increasing Amlodipine to 10 mg. Would like him to log BP's at home CBC, CMP, TSH, d-dimer and CXR unremarkable CVD risk factors: male sex, HTN, family hx of CAD  Repeat thyroid panel and TPO antibodies today Benign thyroid exam  Patient education and anticipatory guidance given Patient agrees with treatment plan Follow-up pending diagnostic test results or sooner as needed if symptoms worsen or fail to improve  Levonne Hubertharley E. Cummings PA-C

## 2018-06-29 LAB — THYROID PEROXIDASE ANTIBODY: Thyroperoxidase Ab SerPl-aCnc: 515 IU/mL — ABNORMAL HIGH (ref <9)

## 2018-06-29 LAB — THYROID PANEL WITH TSH
Free Thyroxine Index: 1.6 (ref 1.4–3.8)
T3 Uptake: 35 % (ref 22–35)
T4, Total: 4.5 ug/dL — ABNORMAL LOW (ref 4.9–10.5)
TSH: 3.57 mIU/L (ref 0.40–4.50)

## 2018-07-04 ENCOUNTER — Encounter: Payer: Self-pay | Admitting: Physician Assistant

## 2018-07-04 DIAGNOSIS — E063 Autoimmune thyroiditis: Secondary | ICD-10-CM | POA: Insufficient documentation

## 2018-07-04 HISTORY — DX: Autoimmune thyroiditis: E06.3

## 2018-08-30 MED FILL — AMLODIPINE BESYLATE 5 MG TA: 5 | 90 days supply | Qty: 90 | Fill #3

## 2018-09-15 DIAGNOSIS — E7800 Pure hypercholesterolemia, unspecified: Secondary | ICD-10-CM | POA: Insufficient documentation

## 2018-12-20 ENCOUNTER — Other Ambulatory Visit: Payer: Self-pay | Admitting: Physician Assistant

## 2018-12-20 DIAGNOSIS — I10 Essential (primary) hypertension: Secondary | ICD-10-CM

## 2018-12-20 MED FILL — AMLODIPINE BESYLATE 5 MG TA: 5 | 90 days supply | Qty: 90 | Fill #0

## 2018-12-23 ENCOUNTER — Other Ambulatory Visit: Payer: Self-pay | Admitting: Neurology

## 2019-03-23 ENCOUNTER — Telehealth: Payer: Self-pay | Admitting: Radiology

## 2019-03-23 NOTE — Telephone Encounter (Signed)
Exercise treadmill test was ordered back in June 2020 when the treadmill room was shut down due to covid. Please advise if patient still needs test scheduled. Currently patients are being asked to have covid test 4 days prior and will be required to self quarantine until appointment.   If no longer needed can you please cancel the orders. Thanks

## 2019-04-11 ENCOUNTER — Encounter: Payer: Self-pay | Admitting: Nurse Practitioner

## 2019-04-11 ENCOUNTER — Other Ambulatory Visit: Payer: Self-pay

## 2019-04-11 ENCOUNTER — Other Ambulatory Visit: Payer: Self-pay | Admitting: Nurse Practitioner

## 2019-04-11 ENCOUNTER — Ambulatory Visit (INDEPENDENT_AMBULATORY_CARE_PROVIDER_SITE_OTHER): Payer: 59 | Admitting: Nurse Practitioner

## 2019-04-11 VITALS — BP 122/76 | HR 69 | Temp 97.8°F | Ht 78.0 in | Wt 222.3 lb

## 2019-04-11 DIAGNOSIS — Z8349 Family history of other endocrine, nutritional and metabolic diseases: Secondary | ICD-10-CM | POA: Diagnosis not present

## 2019-04-11 DIAGNOSIS — Z5181 Encounter for therapeutic drug level monitoring: Secondary | ICD-10-CM

## 2019-04-11 DIAGNOSIS — I10 Essential (primary) hypertension: Secondary | ICD-10-CM

## 2019-04-11 LAB — CBC
HCT: 44.7 % (ref 38.5–50.0)
Hemoglobin: 15.3 g/dL (ref 13.2–17.1)
MCH: 30.5 pg (ref 27.0–33.0)
MCHC: 34.2 g/dL (ref 32.0–36.0)
MCV: 89.2 fL (ref 80.0–100.0)
MPV: 10.8 fL (ref 7.5–12.5)
Platelets: 215 10*3/uL (ref 140–400)
RBC: 5.01 10*6/uL (ref 4.20–5.80)
RDW: 12.3 % (ref 11.0–15.0)
WBC: 4.6 10*3/uL (ref 3.8–10.8)

## 2019-04-11 LAB — THYROID PANEL WITH TSH
Free Thyroxine Index: 2.2 (ref 1.4–3.8)
T3 Uptake: 37 % — ABNORMAL HIGH (ref 22–35)
T4, Total: 6 ug/dL (ref 4.9–10.5)
TSH: 4.6 mIU/L — ABNORMAL HIGH (ref 0.40–4.50)

## 2019-04-11 LAB — COMPLETE METABOLIC PANEL WITH GFR
AG Ratio: 1.8 (calc) (ref 1.0–2.5)
ALT: 19 U/L (ref 9–46)
AST: 20 U/L (ref 10–40)
Albumin: 4.8 g/dL (ref 3.6–5.1)
Alkaline phosphatase (APISO): 60 U/L (ref 36–130)
BUN: 19 mg/dL (ref 7–25)
CO2: 28 mmol/L (ref 20–32)
Calcium: 9.5 mg/dL (ref 8.6–10.3)
Chloride: 102 mmol/L (ref 98–110)
Creat: 1.16 mg/dL (ref 0.60–1.35)
GFR, Est African American: 97 mL/min/{1.73_m2} (ref >=60)
GFR, Est Non African American: 84 mL/min/{1.73_m2} (ref >=60)
Globulin: 2.6 g/dL (calc) (ref 1.9–3.7)
Glucose, Bld: 112 mg/dL — ABNORMAL HIGH (ref 65–99)
Potassium: 4.3 mmol/L (ref 3.5–5.3)
Sodium: 138 mmol/L (ref 135–146)
Total Bilirubin: 0.8 mg/dL (ref 0.2–1.2)
Total Protein: 7.4 g/dL (ref 6.1–8.1)

## 2019-04-11 LAB — LIPID PANEL W/REFLEX DIRECT LDL
Cholesterol: 229 mg/dL — ABNORMAL HIGH (ref <200)
HDL: 56 mg/dL (ref >=40)
LDL Cholesterol (Calc): 154 mg/dL (calc) — ABNORMAL HIGH
Non-HDL Cholesterol (Calc): 173 mg/dL (calc) — ABNORMAL HIGH (ref <130)
Total CHOL/HDL Ratio: 4.1 (calc) (ref <5.0)
Triglycerides: 84 mg/dL (ref <150)

## 2019-04-11 MED ORDER — AMLODIPINE BESYLATE 5 MG PO TABS: 5.0000 mg | ORAL_TABLET | Freq: Every day | ORAL | 1 refills | Status: DC

## 2019-04-11 MED FILL — AMLODIPINE BESYLATE 5 MG TA: 5 | 90 days supply | Qty: 90 | Fill #0

## 2019-04-11 NOTE — Patient Instructions (Addendum)
You are overdue for your annual physical exam.  For your blood pressure: - Goal <130/80 - monitor and log blood pressures at home - check around the same time each day in a relaxed setting - Limit salt to <2000 mg/day - Follow DASH eating plan - limit alcohol to 2 standard drinks per day for men and 1 per day for women - avoid tobacco products - get at least 2 hours of regular aerobic exercise   Hypertension, Adult Hypertension is another name for high blood pressure. High blood pressure forces your heart to work harder to pump blood. This can cause problems over time. There are two numbers in a blood pressure reading. There is a top number (systolic) over a bottom number (diastolic). It is best to have a blood pressure that is below 120/80. Healthy choices can help lower your blood pressure, or you may need medicine to help lower it. What are the causes? The cause of this condition is not known. Some conditions may be related to high blood pressure. What increases the risk?  Smoking.  Having type 2 diabetes mellitus, high cholesterol, or both.  Not getting enough exercise or physical activity.  Being overweight.  Having too much fat, sugar, calories, or salt (sodium) in your diet.  Drinking too much alcohol.  Having long-term (chronic) kidney disease.  Having a family history of high blood pressure.  Age. Risk increases with age.  Race. You may be at higher risk if you are African American.  Gender. Men are at higher risk than women before age 62. After age 9, women are at higher risk than men.  Having obstructive sleep apnea.  Stress. What are the signs or symptoms?  High blood pressure may not cause symptoms. Very high blood pressure (hypertensive crisis) may cause: ? Headache. ? Feelings of worry or nervousness (anxiety). ? Shortness of breath. ? Nosebleed. ? A feeling of being sick to your stomach (nausea). ? Throwing up (vomiting). ? Changes in how you  see. ? Very bad chest pain. ? Seizures. How is this treated?  This condition is treated by making healthy lifestyle changes, such as: ? Eating healthy foods. ? Exercising more. ? Drinking less alcohol.  Your health care provider may prescribe medicine if lifestyle changes are not enough to get your blood pressure under control, and if: ? Your top number is above 130. ? Your bottom number is above 80.  Your personal target blood pressure may vary. Follow these instructions at home: Eating and drinking   If told, follow the DASH eating plan. To follow this plan: ? Fill one half of your plate at each meal with fruits and vegetables. ? Fill one fourth of your plate at each meal with whole grains. Whole grains include whole-wheat pasta, brown rice, and whole-grain bread. ? Eat or drink low-fat dairy products, such as skim milk or low-fat yogurt. ? Fill one fourth of your plate at each meal with low-fat (lean) proteins. Low-fat proteins include fish, chicken without skin, eggs, beans, and tofu. ? Avoid fatty meat, cured and processed meat, or chicken with skin. ? Avoid pre-made or processed food.  Eat less than 1,500 mg of salt each day.  Do not drink alcohol if: ? Your doctor tells you not to drink. ? You are pregnant, may be pregnant, or are planning to become pregnant.  If you drink alcohol: ? Limit how much you use to:  0-1 drink a day for women.  0-2 drinks a day for  men. ? Be aware of how much alcohol is in your drink. In the [place] one drink equals one 12 oz bottle of beer (355 mL), one 5 oz glass of wine (148 mL), or one 1 oz glass of hard liquor (44 mL). Lifestyle   Work with your doctor to stay at a healthy weight or to lose weight. Ask your doctor what the best weight is for you.  Get at least 30 minutes of exercise most days of the week. This may include walking, swimming, or biking.  Get at least 30 minutes of exercise that strengthens your muscles (resistance  exercise) at least 3 days a week. This may include lifting weights or doing Pilates.  Do not use any products that contain nicotine or tobacco, such as cigarettes, e-cigarettes, and chewing tobacco. If you need help quitting, ask your doctor.  Check your blood pressure at home as told by your doctor.  Keep all follow-up visits as told by your doctor. This is important. Medicines  Take over-the-counter and prescription medicines only as told by your doctor. Follow directions carefully.  Do not skip doses of blood pressure medicine. The medicine does not work as well if you skip doses. Skipping doses also puts you at risk for problems.  Ask your doctor about side effects or reactions to medicines that you should watch for. Contact a doctor if you:  Think you are having a reaction to the medicine you are taking.  Have headaches that keep coming back (recurring).  Feel dizzy.  Have swelling in your ankles.  Have trouble with your vision. Get help right away if you:  Get a very bad headache.  Start to feel mixed up (confused).  Feel weak or numb.  Feel faint.  Have very bad pain in your: ? Chest. ? Belly (abdomen).  Throw up more than once.  Have trouble breathing. Summary  Hypertension is another name for high blood pressure.  High blood pressure forces your heart to work harder to pump blood.  For most people, a normal blood pressure is less than 120/80.  Making healthy choices can help lower blood pressure. If your blood pressure does not get lower with healthy choices, you may need to take medicine. This information is not intended to replace advice given to you by your health care provider. Make sure you discuss any questions you have with your health care provider. Document Revised: 09/01/2017 Document Reviewed: 09/01/2017 Elsevier Patient Education  2020 [employer].

## 2019-04-11 NOTE — Progress Notes (Signed)
Established Patient Office Visit  Subjective:  Patient ID: Charles Estrada, male    DOB: February 17, 1990  Age: 29 y.o. MRN: [id number]  CC:  Chief Complaint  Patient presents with  . Hypertension  . Hashimoto's Thyroiditis    HPI Charles Estrada presents for follow-up for hypertension.  He denies any specific concerns today.  He did run out of medication and did not take his dose of amlodipine today but otherwise he reports compliance with his dosages daily.  He has not been taking his blood pressure at home on a regular basis but when he does he reports it is less than 130/90.  He is satisfied with his current treatment.  He denies recurrent headaches, visual changes, palpitations, dyspnea, chest pain, lower extremity edema, or lightheadedness/dizziness.  He is scheduled for an annual physical exam next week with new provider, Dr. Ashley Royalty.   Past Medical History:  Diagnosis Date  . Hashimoto's thyroiditis 07/04/2018  . Hypertension   . Seasonal affective disorder in remission [facility])     Past Surgical History:  Procedure Laterality Date  . ACHILLES TENDON SURGERY  01/31/2011   Procedure: ACHILLES TENDON REPAIR;  Surgeon: Nadara Mustard, MD;  Location: [facility];  Service: Orthopedics;  Laterality: Right;    Family History  Problem Relation Age of Onset  . Hypertension Father   . Hypertension Brother   . Thyroid disease Paternal Aunt   . Thyroid disease Other     Social History   Socioeconomic History  . Marital status: Married    Spouse name: Not on file  . Number of children: Not on file  . Years of education: Not on file  . Highest education level: Not on file  Occupational History  . Not on file  Tobacco Use  . Smoking status: Never Smoker  . Smokeless tobacco: Never Used  Substance and Sexual Activity  . Alcohol use: Yes    Alcohol/week: 10.0 standard drinks    Types: 10 Cans of beer per week  . Drug use: No  . Sexual activity: Yes  Other Topics Concern  . Not on file   Social History Narrative  . Not on file   Social Determinants of Health   Financial Resource Strain:   . Difficulty of Paying Living Expenses:   Food Insecurity:   . Worried About Programme researcher, broadcasting/film/video in the Last Year:   . Barista in the Last Year:   Transportation Needs:   . Freight forwarder (Medical):   Marland Kitchen Lack of Transportation (Non-Medical):   Physical Activity:   . Days of Exercise per Week:   . Minutes of Exercise per Session:   Stress:   . Feeling of Stress :   Social Connections:   . Frequency of Communication with Friends and Family:   . Frequency of Social Gatherings with Friends and Family:   . Attends Religious Services:   . Active Member of Clubs or Organizations:   . Attends Banker Meetings:   Marland Kitchen Marital Status:   Intimate Partner Violence:   . Fear of Current or Ex-Partner:   . Emotionally Abused:   Marland Kitchen Physically Abused:   . Sexually Abused:     Outpatient Medications Prior to Visit  Medication Sig Dispense Refill  . amLODipine (NORVASC) 5 MG tablet Take 1 tablet (5 mg total) by mouth daily. REFILL REQUIRES AN APPOINTMENT AND LABS 90 tablet 0  . b complex vitamins capsule Take 1 capsule by mouth  daily.    . cholecalciferol (VITAMIN D3) 25 MCG (1000 UT) tablet Take 1,000 Units by mouth daily.    . Multiple Vitamin (MULTIVITAMIN) capsule Take 1 capsule by mouth daily.    . Potassium 75 MG TABS Take 1 tablet by mouth daily.     . vitamin C (ASCORBIC ACID) 500 MG tablet Take 500 mg by mouth daily.     No facility-administered medications prior to visit.    No Known Allergies  ROS Review of Systems  Constitutional: Negative for activity change, chills, fatigue, fever and unexpected weight change.  Eyes: Negative for visual disturbance.  Respiratory: Negative for cough, choking, chest tightness and shortness of breath.   Cardiovascular: Negative for chest pain, palpitations and leg swelling.  Gastrointestinal: Negative for  abdominal pain, diarrhea, nausea and vomiting.  Neurological: Negative for dizziness, tremors, syncope, weakness, light-headedness, numbness and headaches.  Psychiatric/Behavioral: Negative for sleep disturbance.      Objective:    Physical Exam  Constitutional: He is oriented to person, place, and time. He appears well-developed and well-nourished.  HENT:  Head: Normocephalic.  Eyes: Pupils are equal, round, and reactive to light. Conjunctivae and EOM are normal.  Neck: No JVD present. Carotid bruit is not present. No thyromegaly present.  Cardiovascular: Normal rate, regular rhythm, normal heart sounds and intact distal pulses. PMI is not displaced.  No murmur heard. Pulmonary/Chest: Effort normal and breath sounds normal. No respiratory distress. He has no wheezes.  Abdominal: Soft. Bowel sounds are normal. He exhibits no distension.  Musculoskeletal:        General: Normal range of motion.     Cervical back: Normal range of motion.  Neurological: He is alert and oriented to person, place, and time. Coordination normal.  Skin: Skin is warm and dry.  Psychiatric: He has a normal mood and affect. His behavior is normal. Judgment and thought content normal.  Nursing note and vitals reviewed.   BP 122/76   Pulse 69   Temp 97.8 F (36.6 C) (Oral)   Ht 6\' 6"  (1.981 m)   Wt 222 lb 4.8 oz (100.8 kg)   SpO2 100%   BMI 25.69 kg/m  Wt Readings from Last 3 Encounters:  04/11/19 222 lb 4.8 oz (100.8 kg)  06/28/18 216 lb (98 kg)  06/14/18 213 lb (96.6 kg)     Health Maintenance Due  Topic Date Due  . HIV Screening  Never done    There are no preventive care reminders to display for this patient.  Lab Results  Component Value Date   TSH 3.57 06/28/2018   Lab Results  Component Value Date   WBC 4.4 06/15/2018   HGB 14.9 06/15/2018   HCT 43.6 06/15/2018   MCV 88.4 06/15/2018   PLT 208 06/15/2018   Lab Results  Component Value Date   NA 138 06/15/2018   K 4.4  06/15/2018   CO2 30 06/15/2018   GLUCOSE 113 (H) 06/15/2018   BUN 16 06/15/2018   CREATININE 1.07 06/15/2018   BILITOT 0.9 06/15/2018   ALKPHOS 61 03/13/2016   AST 25 06/15/2018   ALT 22 06/15/2018   PROT 7.2 06/15/2018   ALBUMIN 5.2 (H) 03/13/2016   CALCIUM 9.6 06/15/2018   Lab Results  Component Value Date   CHOL 198 06/15/2018   Lab Results  Component Value Date   HDL 72 06/15/2018   Lab Results  Component Value Date   LDLCALC 111 (H) 06/15/2018   Lab Results  Component Value Date  TRIG 61 06/15/2018   Lab Results  Component Value Date   CHOLHDL 2.8 06/15/2018   Lab Results  Component Value Date   HGBA1C 5.1 03/13/2016      Assessment & Plan:  1. Encounter for medication monitoring Patient exhibits good compliance with amlodipine daily.  His blood pressure is within normal range today.  Encourage the patient to continue monitoring blood pressure at home, continue exercise and DASH diet. Will obtain complete metabolic panel today.  Patient is fasting at this time and will go ahead and order additional labs required for annual physical exam that is coming up next week. Patient has follow-up appointment with new primary care provider, Dr. Zigmund Daniel, next week.  Plan to follow-up for blood pressure in approximately 6 months with him. - COMPLETE METABOLIC PANEL WITH GFR  2. Essential hypertension Blood pressures well controlled today with no symptoms of adverse effects from medication.  Plan to continue amlodipine 5 m daily.  Patient instructed to monitor blood pressure at home. Labs obtained today.  Labs obtained in preparation for annual physical exam next week. Patient to follow-up in approximately 6 months with Dr. Zigmund Daniel for medication management. - CBC - Lipid Panel w/reflex Direct LDL - amLODipine (NORVASC) 5 MG tablet; Take 1 tablet (5 mg total) by mouth daily. REFILL REQUIRES AN APPOINTMENT AND LABS  Dispense: 90 tablet; Refill: 1  3. Family history of  thyroid disease Family history of thyroid disease with personal history of Hashimoto's thyroiditis.  Last monitoring June of last year.  Will obtain labs today in preparation for annual physical exam next week. - Thyroid Panel With TSH  Follow-up: No follow-ups on file.    Orma Render, NP

## 2019-04-18 ENCOUNTER — Encounter: Payer: Self-pay | Admitting: Family Medicine

## 2019-04-18 ENCOUNTER — Ambulatory Visit (INDEPENDENT_AMBULATORY_CARE_PROVIDER_SITE_OTHER): Payer: 59 | Admitting: Family Medicine

## 2019-04-18 ENCOUNTER — Other Ambulatory Visit: Payer: Self-pay

## 2019-04-18 DIAGNOSIS — Z0000 Encounter for general adult medical examination without abnormal findings: Secondary | ICD-10-CM | POA: Insufficient documentation

## 2019-04-18 DIAGNOSIS — I10 Essential (primary) hypertension: Secondary | ICD-10-CM | POA: Diagnosis not present

## 2019-04-18 DIAGNOSIS — R7989 Other specified abnormal findings of blood chemistry: Secondary | ICD-10-CM | POA: Diagnosis not present

## 2019-04-18 DIAGNOSIS — M7662 Achilles tendinitis, left leg: Secondary | ICD-10-CM

## 2019-04-18 DIAGNOSIS — M7660 Achilles tendinitis, unspecified leg: Secondary | ICD-10-CM | POA: Insufficient documentation

## 2019-04-18 NOTE — Assessment & Plan Note (Signed)
TSH elevated on recent labs with normal T4, will continue to monitor.

## 2019-04-18 NOTE — Assessment & Plan Note (Signed)
Well adult Screenings: UTD Immunizations: UTD Anticipatory guidance/Risk factor reduction:  Continue healthy diet and exercise.  BP is a bit elevated today.  Normal readings at home.  Chest pain he was experiencing previously seems to have resolved. Additional recommendations per AVS

## 2019-04-18 NOTE — Patient Instructions (Signed)
Great to meet you today. Try using voltaren gel to achilles and inside of elbow area.  This is available over the counter.  Also be sure to ice areas after workout.  If still bothersome after a few weeks I would recommend that you see Dr. Darene Lamer here.    Preventive Care 31-31 Years Old, Male Preventive care refers to lifestyle choices and visits with your health care provider that can promote health and wellness. This includes:  A yearly physical exam. This is also called an annual well check.  Regular dental and eye exams.  Immunizations.  Screening for certain conditions.  Healthy lifestyle choices, such as eating a healthy diet, getting regular exercise, not using drugs or products that contain nicotine and tobacco, and limiting alcohol use. What can I expect for my preventive care visit? Physical exam Your health care provider will check:  Height and weight. These may be used to calculate body mass index (BMI), which is a measurement that tells if you are at a healthy weight.  Heart rate and blood pressure.  Your skin for abnormal spots. Counseling Your health care provider may ask you questions about:  Alcohol, tobacco, and drug use.  Emotional well-being.  Home and relationship well-being.  Sexual activity.  Eating habits.  Work and work Statistician. What immunizations do I need?  Influenza (flu) vaccine  This is recommended every year. Tetanus, diphtheria, and pertussis (Tdap) vaccine  You may need a Td booster every 10 years. Varicella (chickenpox) vaccine  You may need this vaccine if you have not already been vaccinated. Human papillomavirus (HPV) vaccine  If recommended by your health care provider, you may need three doses over 6 months. Measles, mumps, and rubella (MMR) vaccine  You may need at least one dose of MMR. You may also need a second dose. Meningococcal conjugate (MenACWY) vaccine  One dose is recommended if you are 92-98 years old and a  Market researcher living in a residence hall, or if you have one of several medical conditions. You may also need additional booster doses. Pneumococcal conjugate (PCV13) vaccine  You may need this if you have certain conditions and were not previously vaccinated. Pneumococcal polysaccharide (PPSV23) vaccine  You may need one or two doses if you smoke cigarettes or if you have certain conditions. Hepatitis A vaccine  You may need this if you have certain conditions or if you travel or work in places where you may be exposed to hepatitis A. Hepatitis B vaccine  You may need this if you have certain conditions or if you travel or work in places where you may be exposed to hepatitis B. Haemophilus influenzae type b (Hib) vaccine  You may need this if you have certain risk factors. You may receive vaccines as individual doses or as more than one vaccine together in one shot (combination vaccines). Talk with your health care provider about the risks and benefits of combination vaccines. What tests do I need? Blood tests  Lipid and cholesterol levels. These may be checked every 5 years starting at age 34.  Hepatitis C test.  Hepatitis B test. Screening   Diabetes screening. This is done by checking your blood sugar (glucose) after you have not eaten for a while (fasting).  Sexually transmitted disease (STD) testing. Talk with your health care provider about your test results, treatment options, and if necessary, the need for more tests. Follow these instructions at home: Eating and drinking   Eat a diet that includes fresh  fruits and vegetables, whole grains, lean protein, and low-fat dairy products.  Take vitamin and mineral supplements as recommended by your health care provider.  Do not drink alcohol if your health care provider tells you not to drink.  If you drink alcohol: ? Limit how much you have to 0-2 drinks a day. ? Be aware of how much alcohol is in your  drink. In the [place] one drink equals one 12 oz bottle of beer (355 mL), one 5 oz glass of wine (148 mL), or one 1 oz glass of hard liquor (44 mL). Lifestyle  Take daily care of your teeth and gums.  Stay active. Exercise for at least 30 minutes on 5 or more days each week.  Do not use any products that contain nicotine or tobacco, such as cigarettes, e-cigarettes, and chewing tobacco. If you need help quitting, ask your health care provider.  If you are sexually active, practice safe sex. Use a condom or other form of protection to prevent STIs (sexually transmitted infections). What's next?  Go to your health care provider once a year for a well check visit.  Ask your health care provider how often you should have your eyes and teeth checked.  Stay up to date on all vaccines. This information is not intended to replace advice given to you by your health care provider. Make sure you discuss any questions you have with your health care provider. Document Revised: 12/16/2017 Document Reviewed: 12/16/2017 Elsevier Patient Education  2020 [employer].

## 2019-04-18 NOTE — Progress Notes (Signed)
Charles Estrada - 29 y.o. male MRN [id number]  Date of birth: Feb 20, 1990  Subjective Chief Complaint  Patient presents with  . Annual Exam    HPI Charles Estrada is a 29 y.o. male with history of HTN and thyroid disease here today for annual exam.   Having some pain in L achilles and L bicep area.  Achilles pain noted after running.  Has history of R achilles tear x2.  He denies feeling of weakness in L foot.  He has reduced running.   He remains active and exercises several times per week.   He is a non smoker.  He consumes around 1-2 beers per day.  Recent labs reviewed with patient.  TSH elevated however T4 is normal.  Cholesterol also elevated.    Review of Systems  Constitutional: Negative for chills, fever, malaise/fatigue and weight loss.  HENT: Negative for congestion, ear pain and sore throat.   Eyes: Negative for blurred vision, double vision and pain.  Respiratory: Negative for cough and shortness of breath.   Cardiovascular: Negative for chest pain and palpitations.  Gastrointestinal: Negative for abdominal pain, blood in stool, constipation, heartburn and nausea.  Genitourinary: Negative for dysuria and urgency.  Musculoskeletal: Negative for joint pain and myalgias.  Neurological: Negative for dizziness and headaches.  Endo/Heme/Allergies: Does not bruise/bleed easily.  Psychiatric/Behavioral: Negative for depression. The patient is not nervous/anxious and does not have insomnia.     No Known Allergies  Past Medical History:  Diagnosis Date  . Hashimoto's thyroiditis 07/04/2018  . Hypertension   . Seasonal affective disorder in remission [facility])     Past Surgical History:  Procedure Laterality Date  . ACHILLES TENDON SURGERY  01/31/2011   Procedure: ACHILLES TENDON REPAIR;  Surgeon: Newt Minion, MD;  Location: [place];  Service: Orthopedics;  Laterality: Right;    Social History   Socioeconomic History  . Marital status: Married    Spouse name: Not on  file  . Number of children: Not on file  . Years of education: Not on file  . Highest education level: Not on file  Occupational History  . Not on file  Tobacco Use  . Smoking status: Never Smoker  . Smokeless tobacco: Never Used  Substance and Sexual Activity  . Alcohol use: Yes    Alcohol/week: 10.0 standard drinks    Types: 10 Cans of beer per week  . Drug use: No  . Sexual activity: Yes  Other Topics Concern  . Not on file  Social History Narrative  . Not on file   Social Determinants of Health   Financial Resource Strain:   . Difficulty of Paying Living Expenses:   Food Insecurity:   . Worried About Charity fundraiser in the Last Year:   . Arboriculturist in the Last Year:   Transportation Needs:   . Film/video editor (Medical):   Marland Kitchen Lack of Transportation (Non-Medical):   Physical Activity:   . Days of Exercise per Week:   . Minutes of Exercise per Session:   Stress:   . Feeling of Stress :   Social Connections:   . Frequency of Communication with Friends and Family:   . Frequency of Social Gatherings with Friends and Family:   . Attends Religious Services:   . Active Member of Clubs or Organizations:   . Attends Archivist Meetings:   Marland Kitchen Marital Status:     Family History  Problem Relation Age of Onset  .  Hypertension Father   . Hypertension Brother   . Thyroid disease Paternal Aunt   . Thyroid disease Other     Health Maintenance  Topic Date Due  . INFLUENZA VACCINE  08/06/2019  . TETANUS/TDAP  09/05/2024  . HIV Screening  Discontinued     ----------------------------------------------------------------------------------------------------------------------------------------------------------------------------------------------------------------- Physical Exam BP (!) 146/75   Pulse 71   Ht 6\' 6"  (1.981 m)   Wt 224 lb (101.6 kg)   BMI 25.89 kg/m   Physical Exam Constitutional:      General: He is not in acute distress. HENT:      Head: Normocephalic and atraumatic.     Right Ear: External ear normal.     Left Ear: External ear normal.  Eyes:     General: No scleral icterus. Neck:     Thyroid: No thyromegaly.  Cardiovascular:     Rate and Rhythm: Normal rate and regular rhythm.     Heart sounds: Normal heart sounds.  Pulmonary:     Effort: Pulmonary effort is normal.     Breath sounds: Normal breath sounds.  Abdominal:     General: Bowel sounds are normal. There is no distension.     Palpations: Abdomen is soft.     Tenderness: There is no abdominal tenderness. There is no guarding.  Musculoskeletal:     Cervical back: Normal range of motion.  Lymphadenopathy:     Cervical: No cervical adenopathy.  Skin:    General: Skin is warm and dry.     Findings: No rash.  Neurological:     Mental Status: He is alert and oriented to person, place, and time.     Cranial Nerves: No cranial nerve deficit.     Motor: No abnormal muscle tone.  Psychiatric:        Mood and Affect: Mood normal.        Behavior: Behavior normal.     ------------------------------------------------------------------------------------------------------------------------------------------------------------------------------------------------------------------- Assessment and Plan  Well adult exam Well adult Screenings: UTD Immunizations: UTD Anticipatory guidance/Risk factor reduction:  Continue healthy diet and exercise.  BP is a bit elevated today.  Normal readings at home.  Chest pain he was experiencing previously seems to have resolved. Additional recommendations per AVS  Elevated TSH TSH elevated on recent labs with normal T4, will continue to monitor.   Achilles tendonitis Recommend voltaren gel to area.  Eccentric stretches recommended. If not improving we can consider ntg patches vs referral to sports med.    No orders of the defined types were placed in this encounter.   No follow-ups on file.    This visit  occurred during the SARS-CoV-2 public health emergency.  Safety protocols were in place, including screening questions prior to the visit, additional usage of staff PPE, and extensive cleaning of exam room while observing appropriate contact time as indicated for disinfecting solutions.

## 2019-04-18 NOTE — Assessment & Plan Note (Signed)
Recommend voltaren gel to area.  Eccentric stretches recommended. If not improving we can consider ntg patches vs referral to sports med.

## 2019-04-25 DIAGNOSIS — H5213 Myopia, bilateral: Secondary | ICD-10-CM | POA: Diagnosis not present

## 2019-04-25 DIAGNOSIS — H52223 Regular astigmatism, bilateral: Secondary | ICD-10-CM | POA: Diagnosis not present

## 2019-04-25 DIAGNOSIS — Z135 Encounter for screening for eye and ear disorders: Secondary | ICD-10-CM | POA: Diagnosis not present

## 2019-05-03 NOTE — Addendum Note (Signed)
Addended by: Mammie Lorenzo on: 05/03/2019 08:08 AM   Modules accepted: Orders

## 2019-07-28 MED FILL — AMLODIPINE BESYLATE 5 MG TA: 5 | 90 days supply | Qty: 90 | Fill #1

## 2019-09-29 MED FILL — IBUPROFEN 600 MG TABLET: 600 | 5 days supply | Qty: 20 | Fill #0

## 2019-09-29 MED FILL — AMOXICILLIN 875 MG TABS: 875 | 7 days supply | Qty: 14 | Fill #0

## 2019-09-29 MED FILL — HYDROCODON-APAP 5-325: 5-325 | 2 days supply | Qty: 8 | Fill #0

## 2019-09-30 MED FILL — CLINDAMYCIN HCL 300 MG CAP: 300 | 7 days supply | Qty: 21 | Fill #0

## 2019-11-15 MED FILL — AMLODIPINE BESYLATE 5 MG TA: 5 | 90 days supply | Qty: 90 | Fill #0

## 2020-03-04 MED FILL — AMLODIPINE BESYLATE 5 MG TA: 5 | 90 days supply | Qty: 90 | Fill #1

## 2020-05-29 ENCOUNTER — Other Ambulatory Visit (HOSPITAL_COMMUNITY): Payer: Self-pay

## 2020-05-29 ENCOUNTER — Other Ambulatory Visit: Payer: Self-pay | Admitting: Family Medicine

## 2020-05-29 DIAGNOSIS — Z5181 Encounter for therapeutic drug level monitoring: Secondary | ICD-10-CM

## 2020-05-29 DIAGNOSIS — I10 Essential (primary) hypertension: Secondary | ICD-10-CM

## 2020-05-29 MED ORDER — AMLODIPINE BESYLATE 5 MG PO TABS
ORAL_TABLET | Freq: Every day | ORAL | 1 refills | Status: DC
  Filled 2020-05-29: qty 90, 90d supply, fill #0
  Filled 2020-09-12: qty 90, 90d supply, fill #1

## 2020-05-30 ENCOUNTER — Other Ambulatory Visit (HOSPITAL_COMMUNITY): Payer: Self-pay

## 2020-06-15 ENCOUNTER — Encounter (HOSPITAL_COMMUNITY): Payer: Self-pay | Admitting: *Deleted

## 2020-06-15 ENCOUNTER — Ambulatory Visit: Admit: 2020-06-15 | Discharge: 2020-06-15 | Payer: 59

## 2020-06-15 ENCOUNTER — Emergency Department (HOSPITAL_COMMUNITY): Payer: No Typology Code available for payment source

## 2020-06-15 ENCOUNTER — Emergency Department (HOSPITAL_COMMUNITY)
Admission: EM | Admit: 2020-06-15 | Discharge: 2020-06-15 | Disposition: A | Discharge status: Home / Self Care | Payer: No Typology Code available for payment source | Attending: Emergency Medicine | Admitting: Emergency Medicine

## 2020-06-15 ENCOUNTER — Other Ambulatory Visit: Payer: Self-pay

## 2020-06-15 DIAGNOSIS — Z79899 Other long term (current) drug therapy: Secondary | ICD-10-CM | POA: Insufficient documentation

## 2020-06-15 DIAGNOSIS — I10 Essential (primary) hypertension: Secondary | ICD-10-CM | POA: Insufficient documentation

## 2020-06-15 DIAGNOSIS — R1031 Right lower quadrant pain: Secondary | ICD-10-CM | POA: Diagnosis present

## 2020-06-15 DIAGNOSIS — A09 Infectious gastroenteritis and colitis, unspecified: Secondary | ICD-10-CM

## 2020-06-15 DIAGNOSIS — K529 Noninfective gastroenteritis and colitis, unspecified: Secondary | ICD-10-CM | POA: Insufficient documentation

## 2020-06-15 LAB — CBC WITH DIFFERENTIAL/PLATELET
Abs Immature Granulocytes: 0.01 10*3/uL (ref 0.00–0.07)
Basophils Absolute: 0.1 10*3/uL (ref 0.0–0.1)
Basophils Relative: 1 %
Eosinophils Absolute: 0.1 10*3/uL (ref 0.0–0.5)
Eosinophils Relative: 2 %
HCT: 42.2 % (ref 39.0–52.0)
Hemoglobin: 15.1 g/dL (ref 13.0–17.0)
Immature Granulocytes: 0 %
Lymphocytes Relative: 29 %
Lymphs Abs: 1.7 10*3/uL (ref 0.7–4.0)
MCH: 30.8 pg (ref 26.0–34.0)
MCHC: 35.8 g/dL (ref 30.0–36.0)
MCV: 86.1 fL (ref 80.0–100.0)
Monocytes Absolute: 0.6 10*3/uL (ref 0.1–1.0)
Monocytes Relative: 11 %
Neutro Abs: 3.4 10*3/uL (ref 1.7–7.7)
Neutrophils Relative %: 57 %
Platelets: 194 10*3/uL (ref 150–400)
RBC: 4.9 MIL/uL (ref 4.22–5.81)
RDW: 11.8 % (ref 11.5–15.5)
WBC: 5.9 10*3/uL (ref 4.0–10.5)
nRBC: 0 % (ref 0.0–0.2)

## 2020-06-15 LAB — LIPASE, BLOOD: Lipase: 41 U/L (ref 11–51)

## 2020-06-15 LAB — COMPREHENSIVE METABOLIC PANEL
ALT: 35 U/L (ref 0–44)
AST: 26 U/L (ref 15–41)
Albumin: 4.6 g/dL (ref 3.5–5.0)
Alkaline Phosphatase: 64 U/L (ref 38–126)
Anion gap: 7 (ref 5–15)
BUN: 14 mg/dL (ref 6–20)
CO2: 28 mmol/L (ref 22–32)
Calcium: 9.4 mg/dL (ref 8.9–10.3)
Chloride: 100 mmol/L (ref 98–111)
Creatinine, Ser: 1.12 mg/dL (ref 0.61–1.24)
GFR, Estimated: >60 mL/min (ref >60)
Glucose, Bld: 92 mg/dL (ref 70–99)
Potassium: 3.5 mmol/L (ref 3.5–5.1)
Sodium: 135 mmol/L (ref 135–145)
Total Bilirubin: 0.9 mg/dL (ref 0.3–1.2)
Total Protein: 7.7 g/dL (ref 6.5–8.1)

## 2020-06-15 LAB — LACTIC ACID, PLASMA: Lactic Acid, Venous: 0.6 mmol/L (ref 0.5–1.9)

## 2020-06-15 LAB — URINALYSIS, ROUTINE W REFLEX MICROSCOPIC
Bilirubin Urine: NEGATIVE
Glucose, UA: NEGATIVE mg/dL
Hgb urine dipstick: NEGATIVE
Ketones, ur: NEGATIVE mg/dL
Leukocytes,Ua: NEGATIVE
Nitrite: NEGATIVE
Protein, ur: NEGATIVE mg/dL
Specific Gravity, Urine: 1.006 (ref 1.005–1.030)
pH: 6 (ref 5.0–8.0)

## 2020-06-15 MED ORDER — CIPROFLOXACIN HCL 250 MG PO TABS
500.0000 mg | ORAL_TABLET | Freq: Once | ORAL | Status: AC
  Administered 2020-06-15: 500 mg via ORAL
  Filled 2020-06-15: qty 2

## 2020-06-15 MED ORDER — DICYCLOMINE HCL 10 MG PO CAPS
10.0000 mg | ORAL_CAPSULE | Freq: Once | ORAL | Status: AC
  Administered 2020-06-15: 10 mg via ORAL
  Filled 2020-06-15: qty 1

## 2020-06-15 MED ORDER — METRONIDAZOLE 500 MG PO TABS: 500.0000 mg | ORAL_TABLET | Freq: Three times a day (TID) | ORAL | 0 refills | Status: AC

## 2020-06-15 MED ORDER — SODIUM CHLORIDE 0.9 % IV BOLUS
1000.0000 mL | Freq: Once | INTRAVENOUS | Status: AC
  Administered 2020-06-15: 1000 mL via INTRAVENOUS

## 2020-06-15 MED ORDER — METRONIDAZOLE 500 MG PO TABS
500.0000 mg | ORAL_TABLET | Freq: Once | ORAL | Status: AC
  Administered 2020-06-15: 500 mg via ORAL
  Filled 2020-06-15: qty 1

## 2020-06-15 MED ORDER — IOHEXOL 300 MG/ML  SOLN
100.0000 mL | Freq: Once | INTRAMUSCULAR | Status: AC | PRN
  Administered 2020-06-15: 100 mL via INTRAVENOUS

## 2020-06-15 MED ORDER — MORPHINE SULFATE (PF) 4 MG/ML IV SOLN
4.0000 mg | Freq: Once | INTRAVENOUS | Status: AC
  Administered 2020-06-15: 4 mg via INTRAVENOUS
  Filled 2020-06-15: qty 1

## 2020-06-15 MED ORDER — DICYCLOMINE HCL 20 MG PO TABS: 20.0000 mg | ORAL_TABLET | Freq: Two times a day (BID) | ORAL | 0 refills | Status: DC

## 2020-06-15 MED ORDER — CIPROFLOXACIN HCL 500 MG PO TABS: 500.0000 mg | ORAL_TABLET | Freq: Two times a day (BID) | ORAL | 0 refills | Status: DC

## 2020-06-15 NOTE — Discharge Instructions (Addendum)
Please pick up antibiotics and take as prescribed. I have also provided Bentyl for abdominal cramping that you can take as needed.   Drink plenty of fluids to stay hydrated and keep your electrolytes up. You can also drink Gatorade/powerade. Eat food as tolerated.   Follow up with your PCP for further evaluation.  Return to the ED for any new/worsening symptoms including no improvement after 48 hours on antibiotics, diarrhea that lasts > 10 days, fevers > 100.4, worsening pain, or any other concerning symptoms.

## 2020-06-15 NOTE — ED Provider Notes (Signed)
[facility] EMERGENCY DEPARTMENT Provider Note   CSN: [id number] Arrival date & time: 06/15/20  1543     History Chief Complaint  Patient presents with   Abdominal Pain    Charles Estrada is a 30 y.o. male who presents to the ED today with complaint of gradual onset, constant, sharp, RLQ abdominal pain for the past 4 days. Pt also complains of watery diarrhea. He denies nausea or vomiting. He has been able to eat and drink however reports his appetite is decreased from baseline. Pt recently returned from his [place]ymoon to [place] however his symptoms only started 2 days after returning and his wife is asymptomatic. He denies any suspicious food intake. No recent abx use. No fevers or chills. [facility] with concern for appendicitis. No previous abdominal surgeries.   The history is provided by the patient and medical records.      Past Medical History:  Diagnosis Date   Hashimoto's thyroiditis 07/04/2018   Hypertension    Seasonal affective disorder in remission [facility](HCC)     Patient Active Problem List   Diagnosis Date Noted   Well adult exam 04/18/2019   Achilles tendonitis 04/18/2019   Elevated LDL cholesterol level 09/15/2018   Hashimoto's thyroiditis 07/04/2018   Family history of thyroid disease 06/28/2018   Electrocardiogram showing T wave abnormalities 06/27/2018   Elevated TSH 06/27/2018   Atypical chest pain 06/14/2018   Encounter for medication monitoring 10/20/2017   Hypertension goal BP (blood pressure) < 130/80 04/10/2016    Past Surgical History:  Procedure Laterality Date   ACHILLES TENDON SURGERY  01/31/2011   Procedure: ACHILLES TENDON REPAIR;  Surgeon: Nadara MustardMarcus V Duda, MD;  Location: [facility] OR;  Service: Orthopedics;  Laterality: Right;       Family History  Problem Relation Age of Onset   Hypertension Father    Hypertension Brother    Thyroid disease Paternal Aunt    Thyroid disease Other     Social History    Tobacco Use   Smoking status: Never   Smokeless tobacco: Never  Substance Use Topics   Alcohol use: Yes    Alcohol/week: 10.0 standard drinks    Types: 10 Cans of beer per week   Drug use: No    Home Medications Prior to Admission medications   Medication Sig Start Date End Date Taking? Authorizing Provider  amLODipine (NORVASC) 5 MG tablet TAKE 1 TABLET BY MOUTH DAILY. 05/29/20 05/29/21 Yes Everrett CoombeMatthews, Cody, DO  b complex vitamins capsule Take 1 capsule by mouth daily.   Yes [provider]  bismuth subsalicylate (PEPTO BISMOL) 262 MG chewable tablet Chew 524 mg by mouth as needed.   Yes [provider]  cholecalciferol (VITAMIN D3) 25 MCG (1000 UT) tablet Take 1,000 Units by mouth daily.   Yes [provider]  ciprofloxacin (CIPRO) 500 MG tablet Take 1 tablet (500 mg total) by mouth 2 (two) times daily. 06/15/20  Yes Nanami Whitelaw, PA-C  dicyclomine (BENTYL) 20 MG tablet Take 1 tablet (20 mg total) by mouth 2 (two) times daily. 06/15/20  Yes Zack Crager, PA-C  metroNIDAZOLE (FLAGYL) 500 MG tablet Take 1 tablet (500 mg total) by mouth 3 (three) times daily for 7 days. 06/15/20 06/22/20 Yes Jonae Renshaw, PA-C  Multiple Vitamin (MULTIVITAMIN) capsule Take 1 capsule by mouth daily.   Yes [provider]  vitamin C (ASCORBIC ACID) 500 MG tablet Take 500 mg by mouth daily.   Yes [provider]  Potassium 75 MG TABS Take 1 tablet by mouth daily.  Patient not taking: Reported on 06/15/2020    [provider]    Allergies    Amoxicillin  Review of Systems   Review of Systems  Constitutional:  Positive for appetite change. Negative for chills and fever.  Gastrointestinal:  Positive for abdominal pain and diarrhea. Negative for nausea and vomiting.  Genitourinary:  Negative for flank pain, scrotal swelling and testicular pain.  All other systems reviewed and are negative.  Physical Exam Updated Vital Signs BP (!) 148/89 (BP  Location: Right Arm)   Pulse 88   Temp 98.3 F (36.8 C) (Oral)   Resp 14   Ht 6\' 6"  (1.981 m)   Wt 104.3 kg   SpO2 100%   BMI 26.58 kg/m   Physical Exam Vitals and nursing note reviewed.  Constitutional:      Appearance: He is not ill-appearing or diaphoretic.  HENT:     Head: Normocephalic and atraumatic.  Eyes:     Conjunctiva/sclera: Conjunctivae normal.  Cardiovascular:     Rate and Rhythm: Normal rate and regular rhythm.     Heart sounds: Normal heart sounds.  Pulmonary:     Effort: Pulmonary effort is normal.     Breath sounds: Normal breath sounds. No wheezing, rhonchi or rales.  Abdominal:     General: Abdomen is flat.     Palpations: Abdomen is soft.     Tenderness: There is abdominal tenderness in the right lower quadrant. There is no right CVA tenderness, left CVA tenderness, guarding or rebound. Positive signs include McBurney's sign. Negative signs include Rovsing's sign, psoas sign and obturator sign.  Musculoskeletal:     Cervical back: Neck supple.  Skin:    General: Skin is warm and dry.  Neurological:     Mental Status: He is alert.    ED Results / Procedures / Treatments   Labs (all labs ordered are listed, but only abnormal results are displayed) Labs Reviewed  URINALYSIS, ROUTINE W REFLEX MICROSCOPIC - Abnormal; Notable for the following components:      Result Value   Color, Urine STRAW (*)    All other components within normal limits  GASTROINTESTINAL PANEL BY PCR, STOOL (REPLACES STOOL CULTURE)  OVA + PARASITE EXAM  COMPREHENSIVE METABOLIC PANEL  LIPASE, BLOOD  CBC WITH DIFFERENTIAL/PLATELET  LACTIC ACID, PLASMA  LACTIC ACID, PLASMA    EKG None  Radiology CT Abdomen Pelvis W Contrast  Result Date: 06/15/2020 CLINICAL DATA:  Right lower quadrant abdominal pain and diarrhea for the past 4 days. EXAM: CT ABDOMEN AND PELVIS WITH CONTRAST TECHNIQUE: Multidetector CT imaging of the abdomen and pelvis was performed using the standard  protocol following bolus administration of intravenous contrast. CONTRAST:  08/15/2020 OMNIPAQUE IOHEXOL 300 MG/ML  SOLN COMPARISON:  None. FINDINGS: Lower chest: No acute abnormality. Hepatobiliary: No focal liver abnormality is seen. No gallstones, gallbladder wall thickening, or biliary dilatation. Pancreas: Unremarkable. No pancreatic ductal dilatation or surrounding inflammatory changes. Spleen: Normal in size without focal abnormality. Adrenals/Urinary Tract: Adrenal glands are unremarkable. Kidneys are normal, without renal calculi, focal lesion, or hydronephrosis. Bladder is unremarkable. Stomach/Bowel: The stomach is within normal limits. Mild wall thickening of the cecum and proximal ascending colon. The terminal ileum is normal. No obstruction. Normal appendix (series 5, image 46). Vascular/Lymphatic: No significant vascular findings are present. No enlarged abdominal or pelvic lymph nodes. Reproductive: Prostate is unremarkable. Other: Trace free fluid in the pelvis.  No  pneumoperitoneum. Musculoskeletal: No acute or significant osseous findings. IMPRESSION: 1. Mild wall thickening of the cecum and proximal ascending colon, consistent with colitis, which could be infectious or inflammatory in etiology. 2. Normal appendix. Electronically Signed   By: Obie Dredge M.D.   On: 06/15/2020 18:19    Procedures Procedures   Medications Ordered in ED Medications  sodium chloride 0.9 % bolus 1,000 mL (1,000 mLs Intravenous New Bag/Given 06/15/20 1716)  morphine 4 MG/ML injection 4 mg (4 mg Intravenous Given 06/15/20 1716)  iohexol (OMNIPAQUE) 300 MG/ML solution 100 mL (100 mLs Intravenous Contrast Given 06/15/20 1757)  dicyclomine (BENTYL) capsule 10 mg (10 mg Oral Given 06/15/20 1853)  ciprofloxacin (CIPRO) tablet 500 mg (500 mg Oral Given 06/15/20 1852)  metroNIDAZOLE (FLAGYL) tablet 500 mg (500 mg Oral Given 06/15/20 1854)    ED Course  I have reviewed the triage vital signs and the nursing  notes.  Pertinent labs & imaging results that were available during my care of the patient were reviewed by me and considered in my medical decision making (see chart for details).    MDM Rules/Calculators/A&P                          30 year old male who presents to the ED today complaining of right lower quadrant abdominal pain and diarrhea for the past 4 days.  Went to urgent care and was sent here to rule out appendicitis.  On arrival to the ED patient is afebrile, nontachycardic and nontachypneic and appears to be no acute distress.  Personally visualized patient ambulating from waiting room without difficulty.  Steady gait.  On my exam he does have right lower quadrant tenderness palpation with positive McBurney's.  Negative Rovsing's, psoas, obturator.  No rebound or guarding.  No flank tenderness palpation.  Patient denies any pain radiating into his testicles.  No urinary symptoms.  We will plan to obtain labs and CT abdomen and pelvis at this time for further evaluation.   CBC without leukocytosis. Hgb stable at 15.1 CMP without electrolyte abnormalities. Potassium 3.5. Creatinine 1.12. No LFT elevation Lipase 41 Lactic acid 0.6  CT: IMPRESSION:  1. Mild wall thickening of the cecum and proximal ascending colon,  consistent with colitis, which could be infectious or inflammatory  in etiology.  2. Normal appendix.   Given recent foreign travel will obtain stool samples at this time to follow up on. Will provide cipro and flagyl for infectious traveler's diarrhea.   On reevaluation pt reports pain is somewhat returning. Will provide bentyl and first dose of antibiotics here given pharmacies are closing soon. Pt recommended to drink plenty of fluids to stay hydrated and to follow up with PCP for same. Strict return precautions discussed with pt. He is in agreement with plan and stable for discharge home.   This note was prepared using Dragon voice recognition software and may include  unintentional dictation errors due to the inherent limitations of voice recognition software.  Final Clinical Impression(s) / ED Diagnoses Final diagnoses:  Right lower quadrant abdominal pain  Infectious diarrhea  Colitis    Rx / DC Orders ED Discharge Orders          Ordered    ciprofloxacin (CIPRO) 500 MG tablet  2 times daily        06/15/20 1852    metroNIDAZOLE (FLAGYL) 500 MG tablet  3 times daily        06/15/20 1852    dicyclomine (  BENTYL) 20 MG tablet  2 times daily        06/15/20 1852             Discharge Instructions      Please pick up antibiotics and take as prescribed. I have also provided Bentyl for abdominal cramping that you can take as needed.   Drink plenty of fluids to stay hydrated and keep your electrolytes up. You can also drink Gatorade/powerade. Eat food as tolerated.   Follow up with your PCP for further evaluation.  Return to the ED for any new/worsening symptoms including no improvement after 48 hours on antibiotics, diarrhea that lasts > 10 days, fevers > 100.4, worsening pain, or any other concerning symptoms.        Tanda Rockers, PA-C 06/15/20 1854    Eber Hong, MD 06/17/20 [phone]

## 2020-06-15 NOTE — ED Triage Notes (Signed)
Right lower abd pain and diarrhea x 4 days

## 2020-06-15 NOTE — ED Notes (Signed)
Last ate/drink an hour and half ago.

## 2020-06-15 NOTE — ED Triage Notes (Signed)
RLQ pain x 4 days with diarrhea.  Seen at Urgent Care earlier and sent here.

## 2020-06-16 ENCOUNTER — Ambulatory Visit: Payer: Self-pay

## 2020-08-30 ENCOUNTER — Ambulatory Visit: Payer: No Typology Code available for payment source | Admitting: Family Medicine

## 2020-09-05 ENCOUNTER — Encounter: Payer: Self-pay | Admitting: Family Medicine

## 2020-09-05 ENCOUNTER — Other Ambulatory Visit: Payer: Self-pay

## 2020-09-05 ENCOUNTER — Ambulatory Visit (INDEPENDENT_AMBULATORY_CARE_PROVIDER_SITE_OTHER): Payer: No Typology Code available for payment source | Admitting: Family Medicine

## 2020-09-05 VITALS — BP 138/92 | HR 76 | Wt 238.0 lb

## 2020-09-05 DIAGNOSIS — R197 Diarrhea, unspecified: Secondary | ICD-10-CM | POA: Diagnosis not present

## 2020-09-05 DIAGNOSIS — I10 Essential (primary) hypertension: Secondary | ICD-10-CM

## 2020-09-05 NOTE — Progress Notes (Signed)
Charles Estrada - 30 y.o. male MRN [id number]  Date of birth: [birth year]  Subjective Chief Complaint  Patient presents with   Hypertension   Diarrhea    HPI Charles Estrada is a 30 year old male here today for follow-up of hypertension and abdominal pain.  After returning from his honeymoon back in June he began having abdominal pain and diarrhea.  Seen in the ED and had CT scan consistent with colitis.  He was started on Cipro and had improvement of pain but since that time he has had loose and watery stools daily.  He is having increased frequency of bowel movements up to 3-4 times per day.  His bowel movements were normal prior to this.  He has not noted any blood in his stool.  He continues on amlodipine at 5 mg for management of hypertension.  He has not noted any side effects related to this.  Blood pressure at home has been well controlled.  He denies chest pain, shortness of breath, palpitations, headache or vision changes.  ROS:  A comprehensive ROS was completed and negative except as noted per HPI  Allergies  Allergen Reactions   Amoxicillin     emesis    Past Medical History:  Diagnosis Date   Hashimoto's thyroiditis 07/04/2018   Hypertension    Seasonal affective disorder in remission [facility])     Past Surgical History:  Procedure Laterality Date   ACHILLES TENDON SURGERY  01/31/2011   Procedure: ACHILLES TENDON REPAIR;  Surgeon: Nadara Mustard, MD;  Location: [facility] OR;  Service: Orthopedics;  Laterality: Right;    Social History   Socioeconomic History   Marital status: Married    Spouse name: Not on file   Number of children: Not on file   Years of education: Not on file   Highest education level: Not on file  Occupational History   Not on file  Tobacco Use   Smoking status: Never   Smokeless tobacco: Never  Substance and Sexual Activity   Alcohol use: Yes    Alcohol/week: 10.0 standard drinks    Types: 10 Cans of beer per week   Drug use: No   Sexual activity: Yes   Other Topics Concern   Not on file  Social History Narrative   Not on file   Social Determinants of Health   Financial Resource Strain: Not on file  Food Insecurity: Not on file  Transportation Needs: Not on file  Physical Activity: Not on file  Stress: Not on file  Social Connections: Not on file    Family History  Problem Relation Age of Onset   Hypertension Father    Hypertension Brother    Thyroid disease Paternal Aunt    Thyroid disease Other     Health Maintenance  Topic Date Due   COVID-19 Vaccine (1) Never done   Hepatitis C Screening  Never done   INFLUENZA VACCINE  08/05/2020   TETANUS/TDAP  09/05/2024   Pneumococcal Vaccine 64-72 Years old  Aged Out   HPV VACCINES  Aged Out   HIV Screening  Discontinued     ----------------------------------------------------------------------------------------------------------------------------------------------------------------------------------------------------------------- Physical Exam BP (!) 138/92   Pulse 76   Wt 238 lb (108 kg)   SpO2 100%   BMI 27.50 kg/m   Physical Exam Constitutional:      Appearance: Normal appearance.  HENT:     Head: Normocephalic and atraumatic.  Eyes:     General: No scleral icterus. Cardiovascular:     Rate and Rhythm: Normal  rate and regular rhythm.  Pulmonary:     Effort: Pulmonary effort is normal.     Breath sounds: Normal breath sounds.  Abdominal:     General: Bowel sounds are normal. There is no distension.     Palpations: Abdomen is soft.  Musculoskeletal:     Cervical back: Neck supple.  Neurological:     Mental Status: He is alert.  Psychiatric:        Mood and Affect: Mood normal.        Behavior: Behavior normal.    ------------------------------------------------------------------------------------------------------------------------------------------------------------------------------------------------------------------- Assessment and  Plan  Hypertension goal BP (blood pressure) < 130/80 Blood pressure remains pretty well controlled.  Continue amlodipine at current strength.  Diarrhea Diarrhea began after completion of Cipro.  Checking C. difficile toxin.  We discussed that if this is negative I would recommend that he add probiotic.   No orders of the defined types were placed in this encounter.   No follow-ups on file.    This visit occurred during the SARS-CoV-2 public health emergency.  Safety protocols were in place, including screening questions prior to the visit, additional usage of staff PPE, and extensive cleaning of exam room while observing appropriate contact time as indicated for disinfecting solutions.

## 2020-09-05 NOTE — Assessment & Plan Note (Signed)
Blood pressure remains pretty well controlled.  Continue amlodipine at current strength.

## 2020-09-05 NOTE — Assessment & Plan Note (Signed)
Diarrhea began after completion of Cipro.  Checking C. difficile toxin.  We discussed that if this is negative I would recommend that he add probiotic.

## 2020-09-06 LAB — C. DIFFICILE GDH AND TOXIN A/B
GDH ANTIGEN: NOT DETECTED
MICRO NUMBER:: 12324392
SPECIMEN QUALITY:: ADEQUATE
TOXIN A AND B: NOT DETECTED

## 2020-09-12 ENCOUNTER — Other Ambulatory Visit (HOSPITAL_COMMUNITY): Payer: Self-pay

## 2020-11-24 ENCOUNTER — Telehealth: Payer: No Typology Code available for payment source | Admitting: Emergency Medicine

## 2020-11-24 DIAGNOSIS — R21 Rash and other nonspecific skin eruption: Secondary | ICD-10-CM | POA: Diagnosis not present

## 2020-11-24 DIAGNOSIS — L299 Pruritus, unspecified: Secondary | ICD-10-CM | POA: Diagnosis not present

## 2020-11-24 DIAGNOSIS — L259 Unspecified contact dermatitis, unspecified cause: Secondary | ICD-10-CM

## 2020-11-24 MED ORDER — PREDNISONE 10 MG (21) PO TBPK: ORAL_TABLET | Freq: Every day | ORAL | 0 refills | Status: DC

## 2020-11-24 NOTE — Progress Notes (Signed)
E Visit for Rash  We are sorry that you are not feeling well. Here is how we plan to help!  Based on what you shared with me it looks like you have contact dermatitis.  Contact dermatitis is a skin rash caused by something that touches the skin and causes irritation or inflammation.  Your skin may be red, swollen, dry, cracked, and itch.  The rash should go away in a few days but can last a few weeks.  If you get a rash, it's important to figure out what caused it so the irritant can be avoided in the future. and I am prescribing a two week course of steroids (37 tablets of 10 mg prednisone).  Days 1-4 take 4 tablets (40 mg) daily  Days 5-8 take 3 tablets (30 mg) daily, Days 9-11 take 2 tablets (20 mg) daily, Days 12-14 take 1 tablet (10 mg) daily.   I will send the steroid to [facility] in [place], as I cannot prescribe medication in states I am not licensed in.  Please call [facility] and they may be able to transfer prescription to desired pharmacy in [place] state.  I hope you feel better soon!  HOME CARE:  Take cool showers and avoid direct sunlight. Apply cool compress or wet dressings. Take a bath in an oatmeal bath.  Sprinkle content of one Aveeno packet under running faucet with comfortably warm water.  Bathe for 15-20 minutes, 1-2 times daily.  Pat dry with a towel. Do not rub the rash. Use hydrocortisone cream. Take an antihistamine like Benadryl for widespread rashes that itch.  The adult dose of Benadryl is 25-50 mg by mouth 4 times daily. Caution:  This type of medication may cause sleepiness.  Do not drink alcohol, drive, or operate dangerous machinery while taking antihistamines.  Do not take these medications if you have prostate enlargement.  Read package instructions thoroughly on all medications that you take.  GET HELP RIGHT AWAY IF:  Symptoms don't go away after treatment. Severe itching that persists. If you rash spreads or swells. If you rash begins to  smell. If it blisters and opens or develops a yellow-brown crust. You develop a fever. You have a sore throat. You become short of breath.  MAKE SURE YOU:  Understand these instructions. Will watch your condition. Will get help right away if you are not doing well or get worse.  Thank you for choosing an e-visit.  Your e-visit answers were reviewed by a board certified advanced clinical practitioner to complete your personal care plan. Depending upon the condition, your plan could have included both over the counter or prescription medications.  Please review your pharmacy choice. Make sure the pharmacy is open so you can pick up prescription now. If there is a problem, you may contact your provider through [employer] and have the prescription routed to another pharmacy.  Your safety is important to [place]. If you have drug allergies check your prescription carefully.   For the next 24 hours you can use MyChart to ask questions about today's visit, request a non-urgent call back, or ask for a work or school excuse. You will get an email in the next two days asking about your experience. I hope that your e-visit has been valuable and will speed your recovery.

## 2020-11-24 NOTE — Progress Notes (Signed)
I have spent 5 minutes in review of e-visit questionnaire, review and updating patient chart, medical decision making and response to patient.   Letti Towell, PA-C    

## 2021-01-13 ENCOUNTER — Encounter: Payer: Self-pay | Admitting: Family Medicine

## 2021-01-13 ENCOUNTER — Other Ambulatory Visit (HOSPITAL_COMMUNITY): Payer: Self-pay

## 2021-01-13 ENCOUNTER — Other Ambulatory Visit: Payer: Self-pay

## 2021-01-13 ENCOUNTER — Ambulatory Visit (INDEPENDENT_AMBULATORY_CARE_PROVIDER_SITE_OTHER): Payer: No Typology Code available for payment source | Admitting: Family Medicine

## 2021-01-13 VITALS — BP 141/94 | HR 92 | Temp 97.6°F | Ht 78.0 in | Wt 243.0 lb

## 2021-01-13 DIAGNOSIS — I10 Essential (primary) hypertension: Secondary | ICD-10-CM | POA: Diagnosis not present

## 2021-01-13 DIAGNOSIS — R7989 Other specified abnormal findings of blood chemistry: Secondary | ICD-10-CM | POA: Diagnosis not present

## 2021-01-13 DIAGNOSIS — Z130 Encounter for screening for diseases of the blood and blood-forming organs and certain disorders involving the immune mechanism: Secondary | ICD-10-CM

## 2021-01-13 DIAGNOSIS — E7800 Pure hypercholesterolemia, unspecified: Secondary | ICD-10-CM

## 2021-01-13 DIAGNOSIS — S43432A Superior glenoid labrum lesion of left shoulder, initial encounter: Secondary | ICD-10-CM | POA: Insufficient documentation

## 2021-01-13 DIAGNOSIS — S43432S Superior glenoid labrum lesion of left shoulder, sequela: Secondary | ICD-10-CM

## 2021-01-13 DIAGNOSIS — E785 Hyperlipidemia, unspecified: Secondary | ICD-10-CM | POA: Insufficient documentation

## 2021-01-13 MED ORDER — AMLODIPINE BESYLATE 5 MG PO TABS
5.0000 mg | ORAL_TABLET | Freq: Every day | ORAL | 3 refills | Status: DC
  Filled 2021-01-13: qty 90, 90d supply, fill #0
  Filled 2021-04-30: qty 90, 90d supply, fill #1
  Filled 2021-08-20: qty 90, 90d supply, fill #2
  Filled 2021-12-03: qty 90, 90d supply, fill #3

## 2021-01-13 NOTE — Patient Instructions (Signed)
Labs (fasting) ordered.  I have placed the referral.  I have refilled your Amlodipine.  Follow up in 6 months to 1 year.  Take care  Dr. Lacinda Axon

## 2021-01-13 NOTE — Assessment & Plan Note (Signed)
Continue amlodipine.  Refilled today.

## 2021-01-13 NOTE — Progress Notes (Signed)
Subjective:  Patient ID: Charles Estrada, male    DOB: February 03, 1990  Age: 31 y.o. MRN: [id number]  CC: Chief Complaint  Patient presents with   Hypertension    Med refill- out x 3 days   left labrum injury    W/ basketball, re-injury at gym 2 weeks ago    HPI:  31 year old male with hypertension presents to establish care with me.  He is a new patient.  Hypertension is stable on amlodipine.  Needs refill.  Patient reports history of labral tear on the left shoulder.  He has been experiencing pain of the left shoulder over the past 2 weeks.  Occurred after he went to the gym.  He states that the shoulder does not feel back to normal.  He did not have repair of his prior labral tear.  He would like to see orthopedics for further evaluation and management regarding this.  Patient Active Problem List   Diagnosis Date Noted   Hyperlipidemia 01/13/2021   Tear of left glenoid labrum 01/13/2021   Elevated TSH 06/27/2018   Essential hypertension 04/10/2016    Social Hx   Social History   Socioeconomic History   Marital status: Married    Spouse name: Not on file   Number of children: Not on file   Years of education: Not on file   Highest education level: Not on file  Occupational History   Not on file  Tobacco Use   Smoking status: Never   Smokeless tobacco: Never  Substance and Sexual Activity   Alcohol use: Yes    Alcohol/week: 10.0 standard drinks    Types: 10 Cans of beer per week   Drug use: No   Sexual activity: Yes  Other Topics Concern   Not on file  Social History Narrative   Not on file   Social Determinants of Health   Financial Resource Strain: Not on file  Food Insecurity: Not on file  Transportation Needs: Not on file  Physical Activity: Not on file  Stress: Not on file  Social Connections: Not on file    Review of Systems  Constitutional: Negative.   Musculoskeletal:        Left shoulder pain.    Objective:  BP (!) 141/94    Pulse 92     Temp 97.6 F (36.4 C)    Ht 6' 6"  (1.981 m)    Wt 243 lb (110.2 kg)    SpO2 98%    BMI 28.08 kg/m   BP/Weight 01/13/2021 09/05/2020 0/73/7106  Systolic BP 269 485 462  Diastolic BP 94 92 75  Wt. (Lbs) 243 238 230  BMI 28.08 27.5 26.58    Physical Exam Vitals and nursing note reviewed.  Constitutional:      General: He is not in acute distress.    Appearance: Normal appearance. He is not ill-appearing.  HENT:     Head: Normocephalic and atraumatic.  Cardiovascular:     Rate and Rhythm: Normal rate and regular rhythm.     Heart sounds: No murmur heard. Pulmonary:     Effort: Pulmonary effort is normal.     Breath sounds: Normal breath sounds. No wheezing, rhonchi or rales.  Neurological:     Mental Status: He is alert.  Psychiatric:        Mood and Affect: Mood normal.        Behavior: Behavior normal.    Lab Results  Component Value Date   WBC 5.9 06/15/2020  HGB 15.1 06/15/2020   HCT 42.2 06/15/2020   PLT 194 06/15/2020   GLUCOSE 92 06/15/2020   CHOL 229 (H) 04/11/2019   TRIG 84 04/11/2019   HDL 56 04/11/2019   LDLCALC 154 (H) 04/11/2019   ALT 35 06/15/2020   AST 26 06/15/2020   NA 135 06/15/2020   K 3.5 06/15/2020   CL 100 06/15/2020   CREATININE 1.12 06/15/2020   BUN 14 06/15/2020   CO2 28 06/15/2020   TSH 4.60 (H) 04/11/2019   HGBA1C 5.1 03/13/2016     Assessment & Plan:   Problem List Items Addressed This Visit       Cardiovascular and Mediastinum   Essential hypertension - Primary    Continue amlodipine.  Refilled today.      Relevant Medications   amLODipine (NORVASC) 5 MG tablet   Other Relevant Orders   CMP14+EGFR     Musculoskeletal and Integument   Tear of left glenoid labrum    Referring to orthopedic surgery.      Relevant Orders   Ambulatory referral to Orthopedic Surgery     Other   Elevated TSH   Relevant Orders   TSH + free T4   Hyperlipidemia   Relevant Medications   amLODipine (NORVASC) 5 MG tablet   Other Relevant  Orders   Lipid panel   Other Visit Diagnoses     Screening for deficiency anemia       Relevant Orders   CBC       Meds ordered this encounter  Medications   amLODipine (NORVASC) 5 MG tablet    Sig: Take 1 tablet (5 mg total) by mouth daily.    Dispense:  90 tablet    Refill:  3    Follow-up: 6 months to a year.  Betsy Layne

## 2021-01-13 NOTE — Assessment & Plan Note (Signed)
Referring to orthopedic surgery 

## 2021-01-30 LAB — LIPID PANEL
Chol/HDL Ratio: 4.4 ratio (ref 0.0–5.0)
Cholesterol, Total: 247 mg/dL — ABNORMAL HIGH (ref 100–199)
HDL: 56 mg/dL (ref >39)
LDL Chol Calc (NIH): 160 mg/dL — ABNORMAL HIGH (ref 0–99)
Triglycerides: 171 mg/dL — ABNORMAL HIGH (ref 0–149)
VLDL Cholesterol Cal: 31 mg/dL (ref 5–40)

## 2021-01-30 LAB — CBC
Hematocrit: 47.9 % (ref 37.5–51.0)
Hemoglobin: 16.4 g/dL (ref 13.0–17.7)
MCH: 30 pg (ref 26.6–33.0)
MCHC: 34.2 g/dL (ref 31.5–35.7)
MCV: 88 fL (ref 79–97)
Platelets: 254 10*3/uL (ref 150–450)
RBC: 5.46 x10E6/uL (ref 4.14–5.80)
RDW: 12.4 % (ref 11.6–15.4)
WBC: 5.1 10*3/uL (ref 3.4–10.8)

## 2021-01-30 LAB — CMP14+EGFR
ALT: 25 IU/L (ref 0–44)
AST: 27 IU/L (ref 0–40)
Albumin/Globulin Ratio: 1.7 (ref 1.2–2.2)
Albumin: 5 g/dL (ref 4.0–5.0)
Alkaline Phosphatase: 83 IU/L (ref 44–121)
BUN/Creatinine Ratio: 11 (ref 9–20)
BUN: 15 mg/dL (ref 6–20)
Bilirubin Total: 0.4 mg/dL (ref 0.0–1.2)
CO2: 25 mmol/L (ref 20–29)
Calcium: 9.6 mg/dL (ref 8.7–10.2)
Chloride: 101 mmol/L (ref 96–106)
Creatinine, Ser: 1.41 mg/dL — ABNORMAL HIGH (ref 0.76–1.27)
Globulin, Total: 2.9 g/dL (ref 1.5–4.5)
Glucose: 102 mg/dL — ABNORMAL HIGH (ref 70–99)
Potassium: 4.8 mmol/L (ref 3.5–5.2)
Sodium: 140 mmol/L (ref 134–144)
Total Protein: 7.9 g/dL (ref 6.0–8.5)
eGFR: 68 mL/min/{1.73_m2} (ref >59)

## 2021-01-30 LAB — TSH+FREE T4
Free T4: 1.15 ng/dL (ref 0.82–1.77)
TSH: 5.31 u[IU]/mL — ABNORMAL HIGH (ref 0.450–4.500)

## 2021-02-10 ENCOUNTER — Encounter: Payer: Self-pay | Admitting: Family Medicine

## 2021-03-05 ENCOUNTER — Telehealth: Payer: No Typology Code available for payment source | Admitting: Physician Assistant

## 2021-03-05 DIAGNOSIS — L247 Irritant contact dermatitis due to plants, except food: Secondary | ICD-10-CM

## 2021-03-05 MED ORDER — PREDNISONE 10 MG PO TABS: ORAL_TABLET | ORAL | 0 refills | Status: DC

## 2021-03-05 NOTE — Progress Notes (Signed)
E Visit for Rash  We are sorry that you are not feeling well. Here is how we plan to help!  Based on what you shared with me it looks like you have contact dermatitis.  Contact dermatitis is a skin rash caused by something that touches the skin and causes irritation or inflammation.  Your skin may be red, swollen, dry, cracked, and itch.  The rash should go away in a few days but can last a few weeks.  If you get a rash, it's important to figure out what caused it so the irritant can be avoided in the future. and I am prescribing a two week course of steroids (37 tablets of 10 mg prednisone).  Days 1-4 take 4 tablets (40 mg) daily  Days 5-8 take 3 tablets (30 mg) daily, Days 9-11 take 2 tablets (20 mg) daily, Days 12-14 take 1 tablet (10 mg) daily.    HOME CARE:  Take cool showers and avoid direct sunlight. Apply cool compress or wet dressings. Take a bath in an oatmeal bath.  Sprinkle content of one Aveeno packet under running faucet with comfortably warm water.  Bathe for 15-20 minutes, 1-2 times daily.  Pat dry with a towel. Do not rub the rash. Use hydrocortisone cream. Take an antihistamine like Benadryl for widespread rashes that itch.  The adult dose of Benadryl is 25-50 mg by mouth 4 times daily. Caution:  This type of medication may cause sleepiness.  Do not drink alcohol, drive, or operate dangerous machinery while taking antihistamines.  Do not take these medications if you have prostate enlargement.  Read package instructions thoroughly on all medications that you take.  GET HELP RIGHT AWAY IF:  Symptoms don't go away after treatment. Severe itching that persists. If you rash spreads or swells. If you rash begins to smell. If it blisters and opens or develops a yellow-brown crust. You develop a fever. You have a sore throat. You become short of breath.  MAKE SURE YOU:  Understand these instructions. Will watch your condition. Will get help right away if you are not doing  well or get worse.  Thank you for choosing an e-visit.  Your e-visit answers were reviewed by a board certified advanced clinical practitioner to complete your personal care plan. Depending upon the condition, your plan could have included both over the counter or prescription medications.  Please review your pharmacy choice. Make sure the pharmacy is open so you can pick up prescription now. If there is a problem, you may contact your provider through MyChart messaging and have the prescription routed to another pharmacy.  Your safety is important to us. If you have drug allergies check your prescription carefully.   For the next 24 hours you can use MyChart to ask questions about today's visit, request a non-urgent call back, or ask for a work or school excuse. You will get an email in the next two days asking about your experience. I hope that your e-visit has been valuable and will speed your recovery.  I provided 5 minutes of non face-to-face time during this encounter for chart review and documentation.   

## 2021-04-30 ENCOUNTER — Other Ambulatory Visit (HOSPITAL_COMMUNITY): Payer: Self-pay

## 2021-07-11 ENCOUNTER — Ambulatory Visit (INDEPENDENT_AMBULATORY_CARE_PROVIDER_SITE_OTHER): Payer: No Typology Code available for payment source | Admitting: Family Medicine

## 2021-07-11 DIAGNOSIS — Z0000 Encounter for general adult medical examination without abnormal findings: Secondary | ICD-10-CM | POA: Diagnosis not present

## 2021-07-11 NOTE — Progress Notes (Signed)
Subjective:  Patient ID: Charles Estrada, male    DOB: 07-11-1990  Age: 31 y.o. MRN: [id number]  CC: Chief Complaint  Patient presents with   well exam    HPI:  31 year old male presents for an annual exam.  Patient states that he is doing well.  Exercises daily.  No tobacco use.  No heavy alcohol use.  Patient declines HIV and hepatitis C screening.  Patient has no complaints or concerns at this time.  Patient's most recent lab work reviewed.  He states that he was in the sauna prior to labs and he believes that that is the culprit for his elevated creatinine.  We discussed his lipid panel.  He would like to wait on medication at this time.  Patient Active Problem List   Diagnosis Date Noted   Annual physical exam 07/11/2021   Hyperlipidemia 01/13/2021   Tear of left glenoid labrum 01/13/2021   Elevated TSH 06/27/2018   Essential hypertension 04/10/2016    Social Hx   Social History   Socioeconomic History   Marital status: Married    Spouse name: Not on file   Number of children: Not on file   Years of education: Not on file   Highest education level: Not on file  Occupational History   Not on file  Tobacco Use   Smoking status: Never   Smokeless tobacco: Never  Substance and Sexual Activity   Alcohol use: Yes    Alcohol/week: 10.0 standard drinks of alcohol    Types: 10 Cans of beer per week   Drug use: No   Sexual activity: Yes  Other Topics Concern   Not on file  Social History Narrative   Not on file   Social Determinants of Health   Financial Resource Strain: Not on file  Food Insecurity: Not on file  Transportation Needs: Not on file  Physical Activity: Not on file  Stress: Not on file  Social Connections: Not on file    Review of Systems  Constitutional: Negative.   Respiratory: Negative.    Cardiovascular: Negative.     Objective:  BP 132/78   Pulse 79   Temp 98.4 F (36.9 C) (Oral)   Ht 6\' 5"  (1.956 m)   Wt 238 lb 6.4 oz (108.1 kg)    SpO2 100%   BMI 28.27 kg/m      07/11/2021    9:11 AM 01/13/2021    8:47 AM 09/05/2020    3:18 PM  BP/Weight  Systolic BP 132 141 138  Diastolic BP 78 94 92  Wt. (Lbs) 238.4 243   BMI 28.27 kg/m2 28.08 kg/m2     Physical Exam Constitutional:      General: He is not in acute distress.    Appearance: Normal appearance. He is not ill-appearing.  HENT:     Head: Normocephalic and atraumatic.     Mouth/Throat:     Pharynx: Oropharynx is clear. No oropharyngeal exudate.  Eyes:     General:        Right eye: No discharge.        Left eye: No discharge.     Conjunctiva/sclera: Conjunctivae normal.  Cardiovascular:     Rate and Rhythm: Normal rate and regular rhythm.  Pulmonary:     Effort: Pulmonary effort is normal.     Breath sounds: Normal breath sounds. No wheezing, rhonchi or rales.  Neurological:     Mental Status: He is alert.  Psychiatric:  Mood and Affect: Mood normal.        Behavior: Behavior normal.     Lab Results  Component Value Date   WBC 5.1 01/29/2021   HGB 16.4 01/29/2021   HCT 47.9 01/29/2021   PLT 254 01/29/2021   GLUCOSE 102 (H) 01/29/2021   CHOL 247 (H) 01/29/2021   TRIG 171 (H) 01/29/2021   HDL 56 01/29/2021   LDLCALC 160 (H) 01/29/2021   ALT 25 01/29/2021   AST 27 01/29/2021   NA 140 01/29/2021   K 4.8 01/29/2021   CL 101 01/29/2021   CREATININE 1.41 (H) 01/29/2021   BUN 15 01/29/2021   CO2 25 01/29/2021   TSH 5.310 (H) 01/29/2021   HGBA1C 5.1 03/13/2016     Assessment & Plan:   Problem List Items Addressed This Visit       Other   Annual physical exam    Doing well.  Laboratory studies reviewed.  Preventative health care updated.  Follow-up annually.      Follow-up:  Return in about 1 year (around 07/12/2022).  Everlene Other DO [facility]

## 2021-07-11 NOTE — Assessment & Plan Note (Signed)
Doing well.  Laboratory studies reviewed.  Preventative health care updated.  Follow-up annually.

## 2021-07-11 NOTE — Patient Instructions (Signed)
Follow up annually.  Watch diet. Stay active.  Take care  Dr. Adriana Simas

## 2021-07-14 ENCOUNTER — Ambulatory Visit: Payer: Self-pay | Admitting: Family Medicine

## 2021-08-20 ENCOUNTER — Other Ambulatory Visit (HOSPITAL_COMMUNITY): Payer: Self-pay

## 2021-11-06 ENCOUNTER — Ambulatory Visit (INDEPENDENT_AMBULATORY_CARE_PROVIDER_SITE_OTHER): Payer: No Typology Code available for payment source | Admitting: Nurse Practitioner

## 2021-11-06 ENCOUNTER — Encounter: Payer: Self-pay | Admitting: Nurse Practitioner

## 2021-11-06 VITALS — BP 134/88 | HR 71 | Temp 97.3°F | Ht 78.0 in | Wt 239.6 lb

## 2021-11-06 DIAGNOSIS — J3489 Other specified disorders of nose and nasal sinuses: Secondary | ICD-10-CM | POA: Diagnosis not present

## 2021-11-06 NOTE — Progress Notes (Signed)
   Subjective:    Patient ID: Charles Estrada, male    DOB: Apr 28, 1990, 31 y.o.   MRN: [id number]  Sinus Problem    Sinus pressure and right ear pressure and headaches x2 days. Denies fevers, chills, body aches, sore throat, SOB. Patient has used ibuprofen and Sudafed with relief.  No other concerns today.  Review of Systems  All other systems reviewed and are negative.      Objective:   Physical Exam Vitals reviewed.  Constitutional:      General: He is not in acute distress.    Appearance: Normal appearance. He is normal weight. He is not ill-appearing, toxic-appearing or diaphoretic.  HENT:     Head: Normocephalic and atraumatic.     Right Ear: Tympanic membrane, ear canal and external ear normal. There is no impacted cerumen.     Left Ear: Tympanic membrane, ear canal and external ear normal. There is no impacted cerumen.     Nose: Nose normal. No congestion or rhinorrhea.     Mouth/Throat:     Mouth: Mucous membranes are moist.     Pharynx: Oropharynx is clear. No oropharyngeal exudate or posterior oropharyngeal erythema.  Eyes:     General:        Right eye: No discharge.        Left eye: No discharge.     Extraocular Movements: Extraocular movements intact.     Conjunctiva/sclera: Conjunctivae normal.     Pupils: Pupils are equal, round, and reactive to light.  Cardiovascular:     Rate and Rhythm: Normal rate and regular rhythm.     Pulses: Normal pulses.     Heart sounds: Normal heart sounds. No murmur heard. Pulmonary:     Effort: Pulmonary effort is normal. No respiratory distress.     Breath sounds: Normal breath sounds. No wheezing.  Musculoskeletal:     Cervical back: Normal range of motion and neck supple. No rigidity or tenderness.     Comments: Grossly intact  Lymphadenopathy:     Cervical: No cervical adenopathy.  Skin:    General: Skin is warm.     Capillary Refill: Capillary refill takes less than 2 seconds.  Neurological:     Mental Status: He  is alert.     Comments: Grossly intact  Psychiatric:        Mood and Affect: Mood normal.        Behavior: Behavior normal.           Assessment & Plan:  1. Sinus pressure - Likely viral or seasonal allergies. - COVID-19, Flu A+B and RSV - May use nasal saline, mucinex, and/or zyrtec over the counter for symptom management - Return to clinic if symptoms do not improve or if they worsen

## 2021-11-09 LAB — COVID-19, FLU A+B AND RSV
Influenza A, NAA: NOT DETECTED
Influenza B, NAA: NOT DETECTED
RSV, NAA: NOT DETECTED
SARS-CoV-2, NAA: NOT DETECTED

## 2021-12-03 ENCOUNTER — Other Ambulatory Visit (HOSPITAL_COMMUNITY): Payer: Self-pay

## 2022-03-16 ENCOUNTER — Other Ambulatory Visit (HOSPITAL_COMMUNITY): Payer: Self-pay

## 2022-03-16 ENCOUNTER — Other Ambulatory Visit: Payer: Self-pay | Admitting: Family Medicine

## 2022-03-16 DIAGNOSIS — I10 Essential (primary) hypertension: Secondary | ICD-10-CM

## 2022-03-16 MED ORDER — AMLODIPINE BESYLATE 5 MG PO TABS
5.0000 mg | ORAL_TABLET | Freq: Every day | ORAL | 1 refills | Status: DC
  Filled 2022-03-16: qty 90, 90d supply, fill #0
  Filled 2022-07-20: qty 90, 90d supply, fill #1

## 2022-03-17 ENCOUNTER — Other Ambulatory Visit (HOSPITAL_COMMUNITY): Payer: Self-pay

## 2022-05-12 IMAGING — CT CT ABD-PELV W/ CM
2 of 4 series · 16 of 46 positions shown, 18 images · IV contrast (Omnipaque or Isovue)
Comparison: None.

CLINICAL DATA: Right lower quadrant abdominal pain and diarrhea for
the past 4 days.

EXAM:
CT ABDOMEN AND PELVIS WITH CONTRAST
TECHNIQUE: Multidetector CT imaging of the abdomen and pelvis was performed
using the standard protocol following bolus administration of
intravenous contrast.
CONTRAST:  100mL OMNIPAQUE IOHEXOL 300 MG/ML  SOLN

[Series 2: axial st · axial · 0.92mm/px · z∈[-909,-409]mm · 13 of 110 slices shown, 15 images]
[im 5/110  soft-tissue]
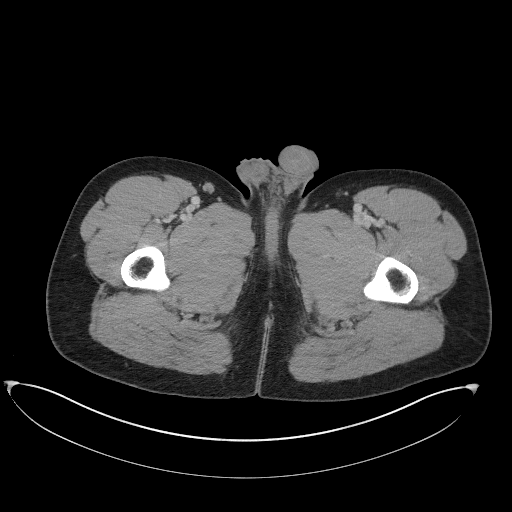
[im 5/110  bone]
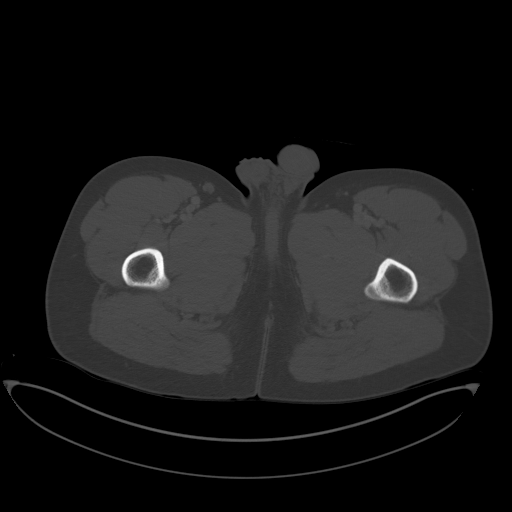
[im 14/110  soft-tissue]
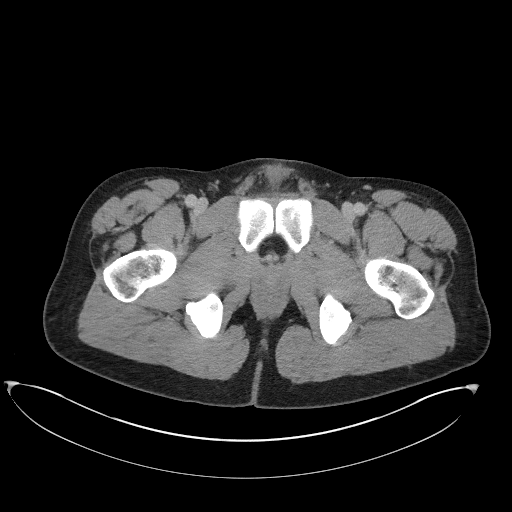
[im 22/110  soft-tissue]
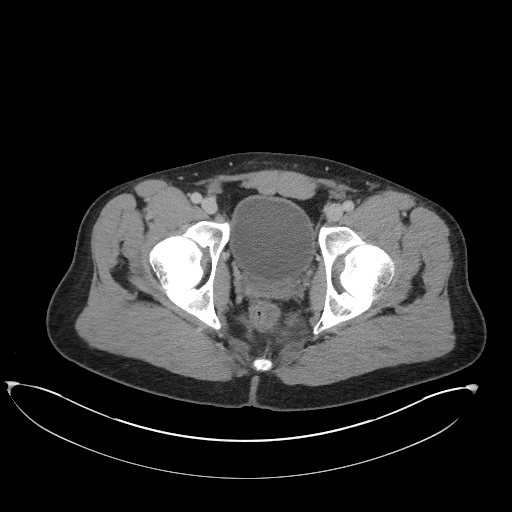
[im 31/110  soft-tissue]
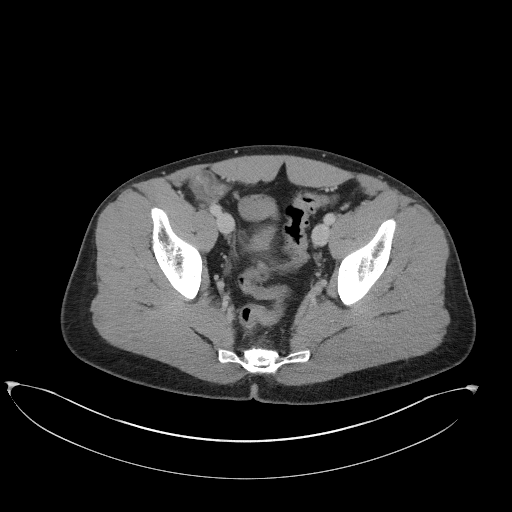
[im 40/110  soft-tissue]
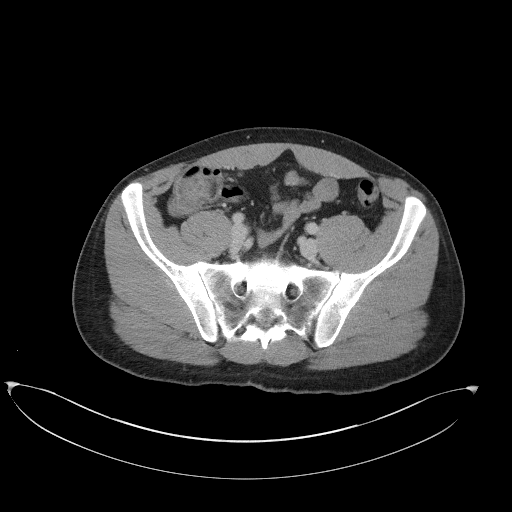
[im 48/110  soft-tissue]
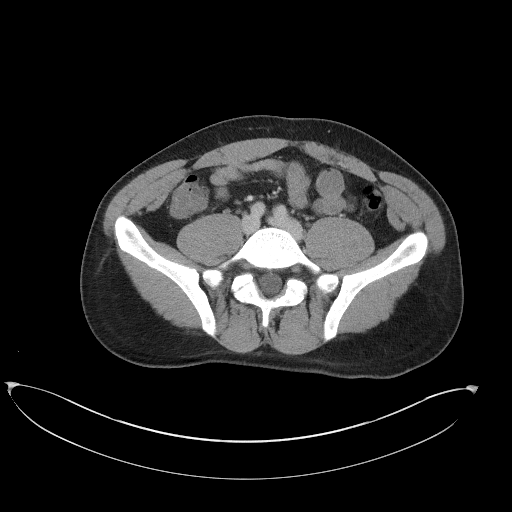
[im 57/110  soft-tissue]
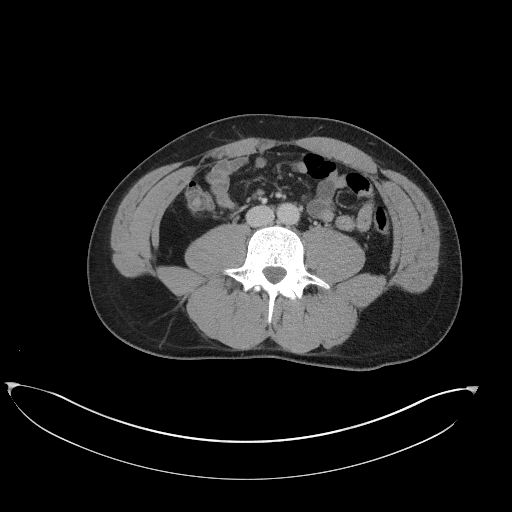
[im 62/110  soft-tissue]
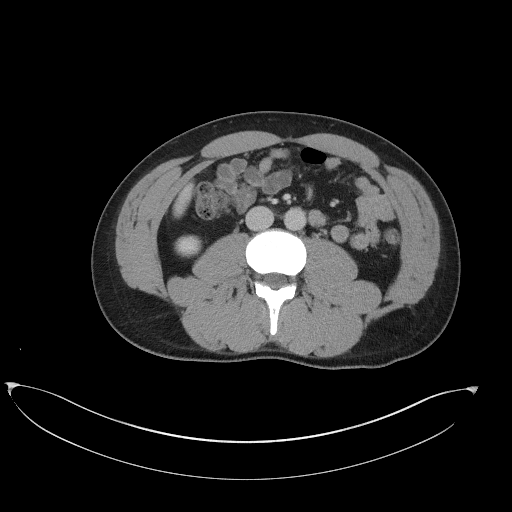
[im 70/110  soft-tissue]
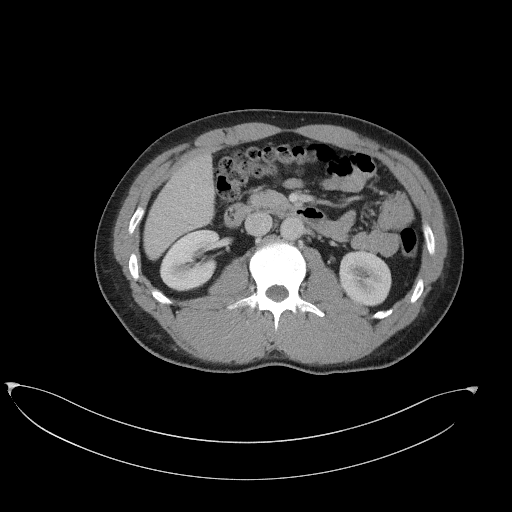
[im 70/110  bone]
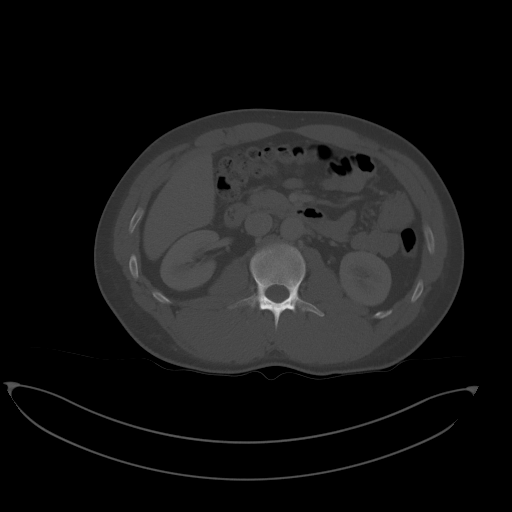
[im 79/110  soft-tissue]
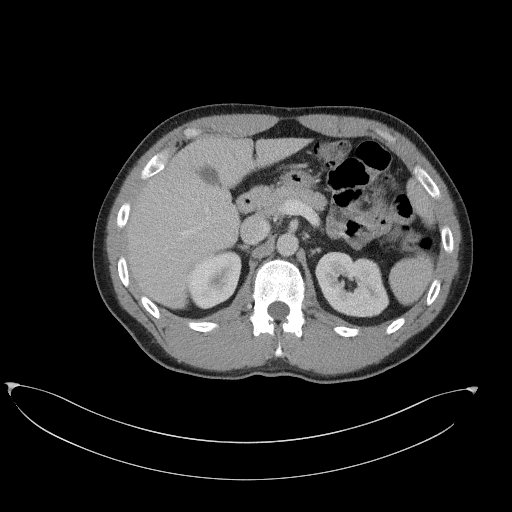
[im 88/110  soft-tissue]
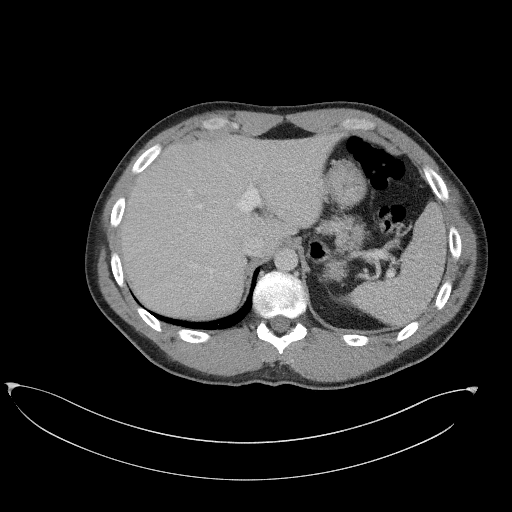
[im 96/110  soft-tissue]
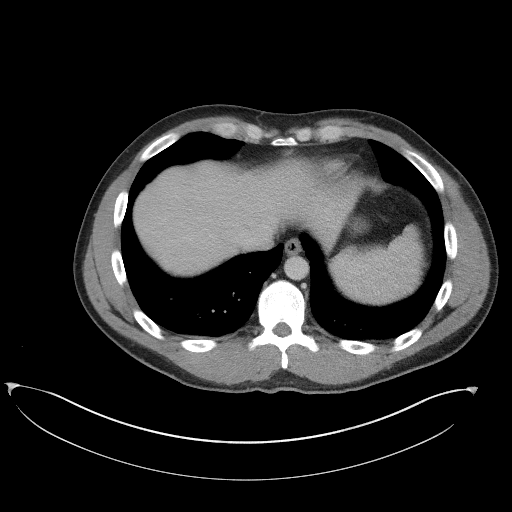
[im 105/110  soft-tissue]
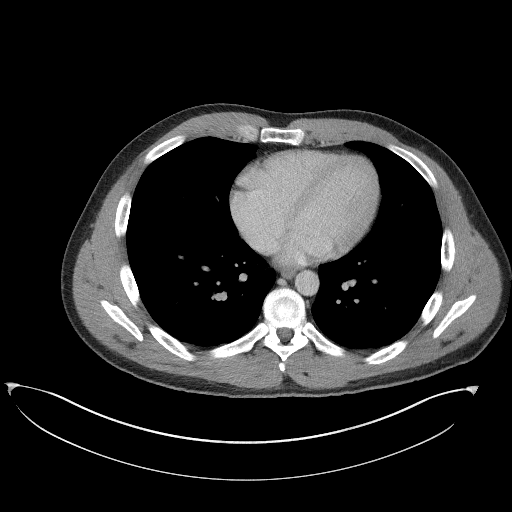

[Series 5: coronal st · coronal · 0.87mm/px · 3 of 122 slices shown]
[im 41/122  soft-tissue]
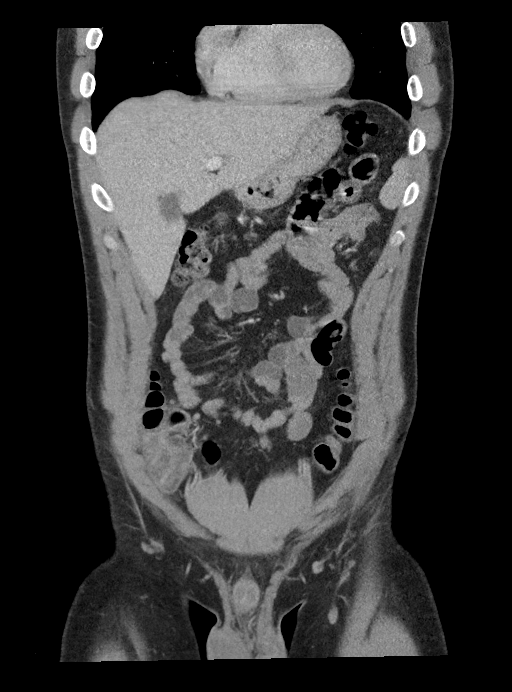
[im 54/122  soft-tissue]
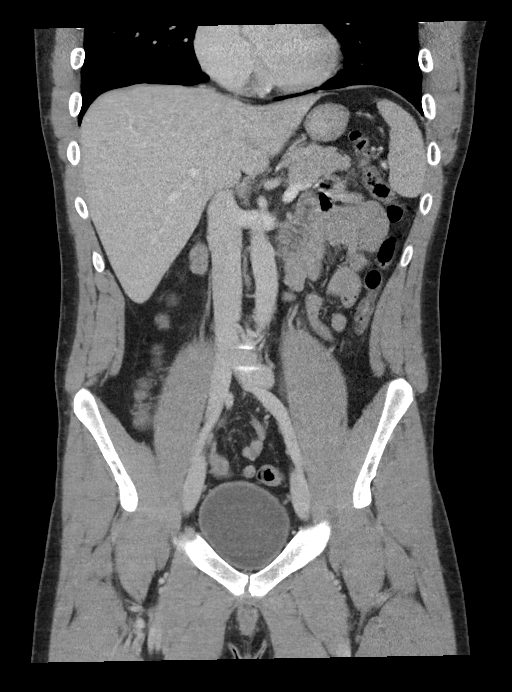
[im 68/122  soft-tissue]
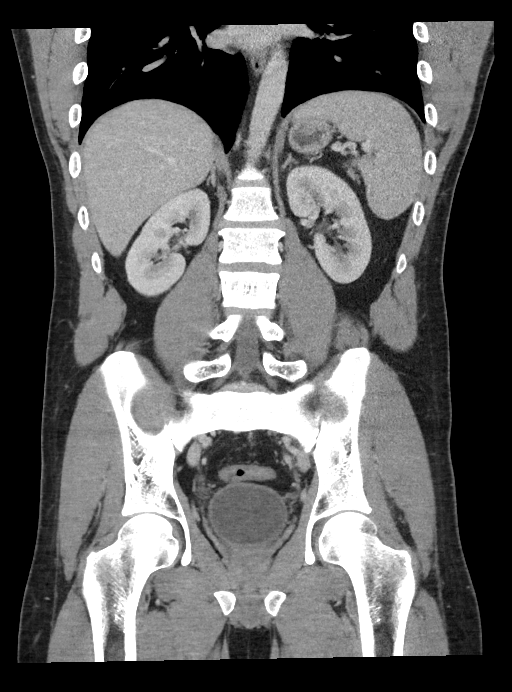

[16 of 46 positions shown; findings below may reference images not displayed]

FINDINGS: Lower chest: No acute abnormality.

Hepatobiliary: No focal liver abnormality is seen. No gallstones,
gallbladder wall thickening, or biliary dilatation.

Pancreas: Unremarkable. No pancreatic ductal dilatation or
surrounding inflammatory changes.

Spleen: Normal in size without focal abnormality.

Adrenals/Urinary Tract: Adrenal glands are unremarkable. Kidneys are
normal, without renal calculi, focal lesion, or hydronephrosis.
Bladder is unremarkable.

Stomach/Bowel: The stomach is within normal limits. Mild wall
thickening of the cecum and proximal ascending colon. The terminal
ileum is normal. No obstruction. Normal appendix (series 5, image
46).

Vascular/Lymphatic: No significant vascular findings are present. No
enlarged abdominal or pelvic lymph nodes.

Reproductive: Prostate is unremarkable.

Other: Trace free fluid in the pelvis.  No pneumoperitoneum.

Musculoskeletal: No acute or significant osseous findings.
IMPRESSION: 1. Mild wall thickening of the cecum and proximal ascending colon,
consistent with colitis, which could be infectious or inflammatory
in etiology.
2. Normal appendix.

## 2022-07-20 ENCOUNTER — Other Ambulatory Visit (HOSPITAL_COMMUNITY): Payer: Self-pay

## 2022-07-23 ENCOUNTER — Other Ambulatory Visit (HOSPITAL_COMMUNITY): Payer: Self-pay

## 2022-08-07 ENCOUNTER — Other Ambulatory Visit (HOSPITAL_COMMUNITY): Payer: Self-pay

## 2022-10-14 ENCOUNTER — Other Ambulatory Visit: Payer: Self-pay

## 2022-10-14 ENCOUNTER — Other Ambulatory Visit: Payer: Self-pay | Admitting: Family Medicine

## 2022-10-14 ENCOUNTER — Other Ambulatory Visit (HOSPITAL_COMMUNITY): Payer: Self-pay

## 2022-10-14 DIAGNOSIS — I10 Essential (primary) hypertension: Secondary | ICD-10-CM

## 2022-10-14 MED ORDER — AMLODIPINE BESYLATE 5 MG PO TABS
5.0000 mg | ORAL_TABLET | Freq: Every day | ORAL | 1 refills | Status: DC
  Filled 2022-10-14: qty 90, 90d supply, fill #0
  Filled 2023-01-12: qty 90, 90d supply, fill #1

## 2023-01-15 ENCOUNTER — Other Ambulatory Visit: Payer: Self-pay

## 2023-04-12 ENCOUNTER — Other Ambulatory Visit: Payer: Self-pay | Admitting: Family Medicine

## 2023-04-12 DIAGNOSIS — I10 Essential (primary) hypertension: Secondary | ICD-10-CM

## 2023-05-17 ENCOUNTER — Other Ambulatory Visit (HOSPITAL_COMMUNITY): Payer: Self-pay

## 2023-05-17 ENCOUNTER — Telehealth: Admitting: Physician Assistant

## 2023-05-17 ENCOUNTER — Encounter (HOSPITAL_COMMUNITY): Payer: Self-pay

## 2023-05-17 DIAGNOSIS — R0982 Postnasal drip: Secondary | ICD-10-CM

## 2023-05-17 DIAGNOSIS — J028 Acute pharyngitis due to other specified organisms: Secondary | ICD-10-CM | POA: Diagnosis not present

## 2023-05-17 DIAGNOSIS — B9689 Other specified bacterial agents as the cause of diseases classified elsewhere: Secondary | ICD-10-CM

## 2023-05-17 MED ORDER — AZITHROMYCIN 250 MG PO TABS
ORAL_TABLET | ORAL | 0 refills | Status: AC
  Filled 2023-05-17: qty 6, 5d supply, fill #0

## 2023-05-17 MED ORDER — FLUTICASONE PROPIONATE 50 MCG/ACT NA SUSP
2.0000 | Freq: Every day | NASAL | 0 refills | Status: DC
  Filled 2023-05-17: qty 16, 30d supply, fill #0

## 2023-05-17 NOTE — Progress Notes (Signed)
 E-Visit for Sore Throat   We are sorry that you are not feeling well.  Here is how we plan to help!  Based on what you have shared with me it is likely that you have bacterial  pharyngitis and post nasal drainage.  Bacterial pharyngitis is inflammation and infection in the back of the throat.  This is an infection cause by bacteria and is treated with antibiotics.  I have prescribed Azithromycin 250 mg two tablets today and then one daily for 4 additional days and Fluticasone nasal spray Use 2 sprays in each nostril daily for 10-14 days for the post nasal drainage. For throat pain, we recommend over the counter oral pain relief medications such as acetaminophen  or aspirin, or anti-inflammatory medications such as ibuprofen or naproxen sodium. Topical treatments such as oral throat lozenges or sprays may be used as needed. Strep infections are not as easily transmitted as other respiratory infections, however we still recommend that you avoid close contact with loved ones, especially the very young and elderly.  Remember to wash your hands thoroughly throughout the day as this is the number one way to prevent the spread of infection and wipe down door knobs and counters with disinfectant.   Home Care: Only take medications as instructed by your medical team. Complete the entire course of an antibiotic. Do not take these medications with alcohol. A steam or ultrasonic humidifier can help congestion.  You can place a towel over your head and breathe in the steam from hot water coming from a faucet. Avoid close contacts especially the very young and the elderly. Cover your mouth when you cough or sneeze. Always remember to wash your hands.  Get Help Right Away If: You develop worsening fever or sinus pain. You develop a severe head ache or visual changes. Your symptoms persist after you have completed your treatment plan.  Make sure you Understand these instructions. Will watch your  condition. Will get help right away if you are not doing well or get worse.   Thank you for choosing an e-visit.  Your e-visit answers were reviewed by a board certified advanced clinical practitioner to complete your personal care plan. Depending upon the condition, your plan could have included both over the counter or prescription medications.  Please review your pharmacy choice. Make sure the pharmacy is open so you can pick up prescription now. If there is a problem, you may contact your provider through [employer] and have the prescription routed to another pharmacy.  Your safety is important to us . If you have drug allergies check your prescription carefully.   For the next 24 hours you can use MyChart to ask questions about today's visit, request a non-urgent call back, or ask for a work or school excuse. You will get an email in the next two days asking about your experience. I hope that your e-visit has been valuable and will speed your recovery.    I have spent 5 minutes in review of e-visit questionnaire, review and updating patient chart, medical decision making and response to patient.   Angelia Kelp, PA-C

## 2023-06-09 ENCOUNTER — Other Ambulatory Visit (HOSPITAL_BASED_OUTPATIENT_CLINIC_OR_DEPARTMENT_OTHER): Payer: Self-pay

## 2023-06-09 ENCOUNTER — Other Ambulatory Visit (HOSPITAL_COMMUNITY): Payer: Self-pay

## 2023-07-01 ENCOUNTER — Other Ambulatory Visit (HOSPITAL_COMMUNITY): Payer: Self-pay

## 2023-07-01 ENCOUNTER — Encounter: Payer: Self-pay | Admitting: Family Medicine

## 2023-07-01 ENCOUNTER — Ambulatory Visit (INDEPENDENT_AMBULATORY_CARE_PROVIDER_SITE_OTHER): Admitting: Family Medicine

## 2023-07-01 VITALS — BP 130/84 | HR 80 | Temp 97.2°F | Ht 78.0 in | Wt 241.0 lb

## 2023-07-01 DIAGNOSIS — E038 Other specified hypothyroidism: Secondary | ICD-10-CM

## 2023-07-01 DIAGNOSIS — I10 Essential (primary) hypertension: Secondary | ICD-10-CM | POA: Diagnosis not present

## 2023-07-01 DIAGNOSIS — Z0001 Encounter for general adult medical examination with abnormal findings: Secondary | ICD-10-CM

## 2023-07-01 DIAGNOSIS — E7800 Pure hypercholesterolemia, unspecified: Secondary | ICD-10-CM

## 2023-07-01 DIAGNOSIS — L255 Unspecified contact dermatitis due to plants, except food: Secondary | ICD-10-CM | POA: Diagnosis not present

## 2023-07-01 DIAGNOSIS — Z0000 Encounter for general adult medical examination without abnormal findings: Secondary | ICD-10-CM

## 2023-07-01 MED ORDER — AMLODIPINE BESYLATE 5 MG PO TABS
5.0000 mg | ORAL_TABLET | Freq: Every day | ORAL | 3 refills | Status: AC
  Filled 2023-07-01: qty 90, 90d supply, fill #0
  Filled 2023-09-22: qty 90, 90d supply, fill #1
  Filled 2023-12-21: qty 90, 90d supply, fill #2

## 2023-07-01 MED ORDER — CLOBETASOL PROPIONATE 0.05 % EX OINT
1.0000 | TOPICAL_OINTMENT | Freq: Two times a day (BID) | CUTANEOUS | 2 refills | Status: DC
  Filled 2023-07-01: qty 30, 15d supply, fill #0
  Filled 2023-07-09: qty 30, 15d supply, fill #1
  Filled 2023-07-24 – 2023-07-26 (×2): qty 30, 15d supply, fill #2

## 2023-07-01 NOTE — Assessment & Plan Note (Deleted)
 Charles Estrada

## 2023-07-01 NOTE — Addendum Note (Signed)
 Addended by: BLUFORD JACQULYN MATSU on: 07/01/2023 04:09 PM   Modules accepted: Level of Service

## 2023-07-01 NOTE — Assessment & Plan Note (Deleted)
Lipid today to assess.

## 2023-07-01 NOTE — Assessment & Plan Note (Signed)
 Overall doing well.  Hepatitis B vaccination has been discontinued from his health maintenance section.  Labs today.  Blood pressure well-controlled.  Blood pressure medication refilled.

## 2023-07-01 NOTE — Assessment & Plan Note (Signed)
 Topical clobetasol  as prescribed.

## 2023-07-01 NOTE — Patient Instructions (Signed)
 Medications sent in.  Labs today.  Follow up annually.

## 2023-07-01 NOTE — Assessment & Plan Note (Signed)
 Stable.  Amlodipine  refilled.

## 2023-07-01 NOTE — Progress Notes (Addendum)
 Subjective:  Patient ID: Charles Estrada, male    DOB: 1990/10/09  Age: 33 y.o. MRN: [id number]  CC:   Chief Complaint  Patient presents with   Annual Exam   Medication Refill    HPI:  33 year old male presents for an annual exam.  Patient states that he needs medication refills.  Blood pressure well-controlled on amlodipine .  Patient has history of subclinical hypothyroidism.  Needs labs.  Preventative health care essentially up-to-date.  HPV and hepatitis B are all in his health maintenance section.  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  Also reports that he has recently developed poison ivy on the right side of his neck.  He would like a medication for this.  Patient Active Problem List   Diagnosis Date Noted   Subclinical hypothyroidism 07/01/2023   Dermatitis due to plants, including poison ivy, sumac, and oak 07/01/2023   Annual physical exam 07/11/2021   Hyperlipidemia 01/13/2021   Tear of left glenoid labrum 01/13/2021   Essential hypertension 04/10/2016    Social Hx   Social History   Socioeconomic History   Marital status: Married    Spouse name: Not on file   Number of children: Not on file   Years of education: Not on file   Highest education level: Not on file  Occupational History   Not on file  Tobacco Use   Smoking status: Never   Smokeless tobacco: Never  Substance and Sexual Activity   Alcohol use: Yes    Alcohol/week: 10.0 standard drinks of alcohol    Types: 10 Cans of beer per week   Drug use: No   Sexual activity: Yes  Other Topics Concern   Not on file  Social History Narrative   Not on file   Social Drivers of Health   Financial Resource Strain: Not on file  Food Insecurity: Not on file  Transportation Needs: Not on file  Physical Activity: Not on file  Stress: Not on file  Social Connections: Not on file    Review of Systems Per HPI  Objective:  BP 130/84   Pulse  80   Temp (!) 97.2 F (36.2 C)   Ht 6' 6 (1.981 m)   Wt 241 lb (109.3 kg)   SpO2 98%   BMI 27.85 kg/m      07/01/2023   10:09 AM 11/06/2021   10:53 AM 07/11/2021    9:11 AM  BP/Weight  Systolic BP 130 134 132  Diastolic BP 84 88 78  Wt. (Lbs) 241 239.6 238.4  BMI 27.85 kg/m2 27.69 kg/m2 28.27 kg/m2    Physical Exam Vitals and nursing note reviewed.  Constitutional:      General: He is not in acute distress.    Appearance: Normal appearance.  HENT:     Head: Normocephalic and atraumatic.  Neck:     Comments: Erythematous raised rash to the right side of the neck extending inferiorly. Cardiovascular:     Rate and Rhythm: Normal rate and regular rhythm.  Pulmonary:     Effort: Pulmonary effort is normal.     Breath sounds: Normal breath sounds.   Neurological:     Mental Status: He is alert.   Psychiatric:        Mood and Affect: Mood normal.        Behavior: Behavior normal.     Lab Results  Component Value Date   WBC 5.1 01/29/2021  HGB 16.4 01/29/2021   HCT 47.9 01/29/2021   PLT 254 01/29/2021   GLUCOSE 102 (H) 01/29/2021   CHOL 247 (H) 01/29/2021   TRIG 171 (H) 01/29/2021   HDL 56 01/29/2021   LDLCALC 160 (H) 01/29/2021   ALT 25 01/29/2021   AST 27 01/29/2021   NA 140 01/29/2021   K 4.8 01/29/2021   CL 101 01/29/2021   CREATININE 1.41 (H) 01/29/2021   BUN 15 01/29/2021   CO2 25 01/29/2021   TSH 5.310 (H) 01/29/2021   HGBA1C 5.1 03/13/2016     Assessment & Plan:  Annual physical exam Assessment & Plan: Overall doing well.  Hepatitis B vaccination has been discontinued from his health maintenance section.  Labs today.  Blood pressure well-controlled.  Blood pressure medication refilled.   Essential hypertension Assessment & Plan: Stable.  Amlodipine  refilled.  Orders: -     amLODIPine  Besylate; Take 1 tablet (5 mg total) by mouth daily.  Dispense: 90 tablet; Refill: 3 -     Basic metabolic panel with GFR  Pure hypercholesterolemia -      Lipid panel  Subclinical hypothyroidism -     TSH + free T4  Dermatitis due to plants, including poison ivy, sumac, and oak Assessment & Plan: Topical clobetasol as prescribed.  Orders: -     Clobetasol Propionate; Apply 1 Application topically 2 (two) times daily.  Dispense: 30 g; Refill: 2    Follow-up:  Annually  Jacqulyn Ahle DO [facility]

## 2023-07-06 DIAGNOSIS — E7800 Pure hypercholesterolemia, unspecified: Secondary | ICD-10-CM | POA: Diagnosis not present

## 2023-07-06 DIAGNOSIS — R7989 Other specified abnormal findings of blood chemistry: Secondary | ICD-10-CM | POA: Diagnosis not present

## 2023-07-06 DIAGNOSIS — I10 Essential (primary) hypertension: Secondary | ICD-10-CM | POA: Diagnosis not present

## 2023-07-07 ENCOUNTER — Ambulatory Visit: Payer: Self-pay | Admitting: Family Medicine

## 2023-07-07 LAB — BASIC METABOLIC PANEL WITH GFR
BUN/Creatinine Ratio: 12 (ref 9–20)
BUN: 14 mg/dL (ref 6–20)
CO2: 23 mmol/L (ref 20–29)
Calcium: 10 mg/dL (ref 8.7–10.2)
Chloride: 100 mmol/L (ref 96–106)
Creatinine, Ser: 1.18 mg/dL (ref 0.76–1.27)
Glucose: 91 mg/dL (ref 70–99)
Potassium: 4.7 mmol/L (ref 3.5–5.2)
Sodium: 140 mmol/L (ref 134–144)
eGFR: 83 mL/min/{1.73_m2} (ref >59)

## 2023-07-07 LAB — LIPID PANEL
Chol/HDL Ratio: 4.9 ratio (ref 0.0–5.0)
Cholesterol, Total: 263 mg/dL — ABNORMAL HIGH (ref 100–199)
HDL: 54 mg/dL (ref >39)
LDL Chol Calc (NIH): 168 mg/dL — ABNORMAL HIGH (ref 0–99)
Triglycerides: 222 mg/dL — ABNORMAL HIGH (ref 0–149)
VLDL Cholesterol Cal: 41 mg/dL — ABNORMAL HIGH (ref 5–40)

## 2023-07-07 LAB — TSH+FREE T4
Free T4: 1.25 ng/dL (ref 0.82–1.77)
TSH: 4.53 u[IU]/mL — ABNORMAL HIGH (ref 0.450–4.500)

## 2023-07-13 ENCOUNTER — Other Ambulatory Visit (HOSPITAL_COMMUNITY): Payer: Self-pay

## 2023-07-18 ENCOUNTER — Ambulatory Visit
Admission: EM | Admit: 2023-07-18 | Discharge: 2023-07-18 | Disposition: A | Discharge status: Home / Self Care | Attending: Family Medicine | Admitting: Family Medicine

## 2023-07-18 DIAGNOSIS — S0501XA Injury of conjunctiva and corneal abrasion without foreign body, right eye, initial encounter: Secondary | ICD-10-CM | POA: Diagnosis not present

## 2023-07-18 MED ORDER — ERYTHROMYCIN 5 MG/GM OP OINT: TOPICAL_OINTMENT | OPHTHALMIC | 0 refills | Status: DC

## 2023-07-18 NOTE — ED Provider Notes (Signed)
 [facility]    CSN: [id number] Arrival date & time: 07/18/23  0825      History   Chief Complaint No chief complaint on file.   HPI Charles Estrada is a 33 y.o. male.   Patient presenting today with right eye redness, pain and irritation, clear drainage after daughter accidentally scratched him in the eye while they were playing yesterday.  Denies visual change, headache, nausea, vomiting.  So far not trying anything over-the-counter for symptoms.    Past Medical History:  Diagnosis Date   Hashimoto's thyroiditis 07/04/2018   Hypertension    Seasonal affective disorder in remission [facility])     Patient Active Problem List   Diagnosis Date Noted   Subclinical hypothyroidism 07/01/2023   Dermatitis due to plants, including poison ivy, sumac, and oak 07/01/2023   Annual physical exam 07/11/2021   Hyperlipidemia 01/13/2021   Tear of left glenoid labrum 01/13/2021   Essential hypertension 04/10/2016    Past Surgical History:  Procedure Laterality Date   ACHILLES TENDON SURGERY  01/31/2011   Procedure: ACHILLES TENDON REPAIR;  Surgeon: Jerona LULLA Sage, MD;  Location: [facility] OR;  Service: Orthopedics;  Laterality: Right;       Home Medications    Prior to Admission medications   Medication Sig Start Date End Date Taking? Authorizing Provider  erythromycin  ophthalmic ointment Place a 1/2 inch ribbon of ointment into the right lower eyelid BID. 07/18/23  Yes Stuart Vernell Norris, PA-C  amLODipine  (NORVASC ) 5 MG tablet Take 1 tablet (5 mg total) by mouth daily. 07/01/23 12/28/23  Cook, Jayce G, DO  b complex vitamins capsule Take 1 capsule by mouth daily.    [provider]  cholecalciferol (VITAMIN D3) 25 MCG (1000 UT) tablet Take 1,000 Units by mouth daily.    [provider]  clobetasol  ointment (TEMOVATE ) 0.05 % Apply 1 Application topically 2 (two) times daily. 07/01/23   Cook, Jayce G, DO  fluticasone  (FLONASE ) 50 MCG/ACT nasal spray Place 2  sprays into both nostrils daily. Patient not taking: Reported on 07/01/2023 05/17/23   Vivienne Delon HERO, PA-C  Multiple Vitamin (MULTIVITAMIN) capsule Take 1 capsule by mouth daily.    [provider]  Potassium 75 MG TABS Take 1 tablet by mouth daily.    [provider]  vitamin C (ASCORBIC ACID) 500 MG tablet Take 500 mg by mouth daily.    [provider]    Family History Family History  Problem Relation Age of Onset   Hypertension Father    Hypertension Brother    Thyroid  disease Paternal Aunt    Thyroid  disease Other     Social History Social History   Tobacco Use   Smoking status: Never   Smokeless tobacco: Never  Substance Use Topics   Alcohol use: Yes    Alcohol/week: 10.0 standard drinks of alcohol    Types: 10 Cans of beer per week   Drug use: No     Allergies   Amoxicillin    Review of Systems Review of Systems Per HPI  Physical Exam Triage Vital Signs ED Triage Vitals  Encounter Vitals Group     BP 07/18/23 1006 (!) 167/97     Girls Systolic BP Percentile --      Girls Diastolic BP Percentile --      Boys Systolic BP Percentile --      Boys Diastolic BP Percentile --      Pulse Rate 07/18/23 1006 (!) 106  Resp 07/18/23 1006 20     Temp 07/18/23 1006 98.6 F (37 C)     Temp Source 07/18/23 1006 Oral     SpO2 07/18/23 1006 96 %     Weight --      Height --      Head Circumference --      Peak Flow --      Pain Score 07/18/23 1008 8     Pain Loc --      Pain Education --      Exclude from Growth Chart --    No data found.  Updated Vital Signs BP (!) 167/97 (BP Location: Right Arm)   Pulse (!) 106   Temp 98.6 F (37 C) (Oral)   Resp 20   SpO2 96%   Visual Acuity Right Eye Distance:   Left Eye Distance:   Bilateral Distance:    Right Eye Near:   Left Eye Near:    Bilateral Near:     Physical Exam Vitals and nursing note reviewed.  Constitutional:      Appearance: Normal appearance.  HENT:      Head: Atraumatic.     Mouth/Throat:     Mouth: Mucous membranes are moist.  Eyes:     Extraocular Movements: Extraocular movements intact.     Pupils: Pupils are equal, round, and reactive to light.     Comments: Diffuse erythema and injection of right conjunctiva, clear drainage present  Area of increased uptake of fluorescein stain on fluorescein exam today to the right conjunctiva  Cardiovascular:     Rate and Rhythm: Normal rate.  Pulmonary:     Effort: Pulmonary effort is normal.  Musculoskeletal:        General: Normal range of motion.     Cervical back: Normal range of motion and neck supple.  Skin:    General: Skin is warm and dry.  Neurological:     Mental Status: He is oriented to person, place, and time.  Psychiatric:        Mood and Affect: Mood normal.        Thought Content: Thought content normal.        Judgment: Judgment normal.      UC Treatments / Results  Labs (all labs ordered are listed, but only abnormal results are displayed) Labs Reviewed - No data to display  EKG   Radiology No results found.  Procedures Procedures (including critical care time)  Medications Ordered in UC Medications - No data to display  Initial Impression / Assessment and Plan / UC Course  I have reviewed the triage vital signs and the nursing notes.  Pertinent labs & imaging results that were available during my care of the patient were reviewed by me and considered in my medical decision making (see chart for details).     Treat for corneal abrasion with erythromycin  ointment, cool compresses, over-the-counter pain relievers as needed.  Visual acuity declined as vision intact per patient.  Return for worsening symptoms.  Final Clinical Impressions(s) / UC Diagnoses   Final diagnoses:  Abrasion of right cornea, initial encounter   Discharge Instructions   None    ED Prescriptions     Medication Sig Dispense Auth. Provider   erythromycin  ophthalmic ointment  Place a 1/2 inch ribbon of ointment into the right lower eyelid BID. 3.5 g Stuart Vernell Norris, PA-C      PDMP not reviewed this encounter.   Stuart Vernell Norris, PA-C 07/18/23 1104

## 2023-07-18 NOTE — ED Triage Notes (Signed)
 Pt reports right eye pain states he was playing with his daughter and she accidentally scratched him.

## 2023-07-20 ENCOUNTER — Other Ambulatory Visit (HOSPITAL_COMMUNITY): Payer: Self-pay

## 2023-07-20 DIAGNOSIS — S0501XA Injury of conjunctiva and corneal abrasion without foreign body, right eye, initial encounter: Secondary | ICD-10-CM | POA: Diagnosis not present

## 2023-07-20 MED ORDER — MOXIFLOXACIN HCL 0.5 % OP SOLN: 1.0000 [drp] | Freq: Four times a day (QID) | OPHTHALMIC | 0 refills | Status: DC

## 2023-07-21 DIAGNOSIS — S0501XD Injury of conjunctiva and corneal abrasion without foreign body, right eye, subsequent encounter: Secondary | ICD-10-CM | POA: Diagnosis not present

## 2023-07-22 DIAGNOSIS — S0501XD Injury of conjunctiva and corneal abrasion without foreign body, right eye, subsequent encounter: Secondary | ICD-10-CM | POA: Diagnosis not present

## 2023-07-24 ENCOUNTER — Other Ambulatory Visit (HOSPITAL_COMMUNITY): Payer: Self-pay

## 2023-07-26 ENCOUNTER — Other Ambulatory Visit: Payer: Self-pay

## 2023-07-29 ENCOUNTER — Other Ambulatory Visit (HOSPITAL_COMMUNITY): Payer: Self-pay

## 2023-08-25 ENCOUNTER — Ambulatory Visit: Admitting: Family Medicine

## 2023-08-25 VITALS — BP 136/88 | HR 87 | Temp 97.5°F | Ht 78.0 in | Wt 238.0 lb

## 2023-08-25 DIAGNOSIS — L219 Seborrheic dermatitis, unspecified: Secondary | ICD-10-CM

## 2023-08-25 MED ORDER — CLOBETASOL PROPIONATE 0.05 % EX SOLN: 1.0000 | Freq: Every day | CUTANEOUS | 0 refills | Status: AC

## 2023-08-25 MED ORDER — KETOCONAZOLE 2 % EX SHAM: MEDICATED_SHAMPOO | CUTANEOUS | 0 refills | Status: AC

## 2023-08-25 NOTE — Progress Notes (Signed)
 Subjective:  Patient ID: Charles Estrada, male    DOB: 10/03/1990  Age: 33 y.o. MRN: [id number]  CC:   Chief Complaint  Patient presents with   scalp redness and itching     After a haircut going on 2 weeks     HPI:  33 year old male presents for evaluation of the above.  Patient reports that he has had ongoing scalp redness, itching, and dryness/flaking over the past 2 weeks.  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  He has been using some head and shoulders with some improvement but without resolution.  He got an appoint with dermatology but could not be seen until September.  No other complaints axillary at this time.  Patient Active Problem List   Diagnosis Date Noted   Seborrheic dermatitis 08/25/2023   Subclinical hypothyroidism 07/01/2023   Annual physical exam 07/11/2021   Hyperlipidemia 01/13/2021   Tear of left glenoid labrum 01/13/2021   Essential hypertension 04/10/2016    Social Hx   Social History   Socioeconomic History   Marital status: Married    Spouse name: Not on file   Number of children: Not on file   Years of education: Not on file   Highest education level: Not on file  Occupational History   Not on file  Tobacco Use   Smoking status: Never   Smokeless tobacco: Never  Substance and Sexual Activity   Alcohol use: Yes    Alcohol/week: 10.0 standard drinks of alcohol    Types: 10 Cans of beer per week   Drug use: No   Sexual activity: Yes  Other Topics Concern   Not on file  Social History Narrative   Not on file   Social Drivers of Health   Financial Resource Strain: Not on file  Food Insecurity: Not on file  Transportation Needs: Not on file  Physical Activity: Not on file  Stress: Not on file  Social Connections: Not on file    Review of Systems Per HPI  Objective:  BP 136/88   Pulse 87   Temp (!) 97.5 F (36.4 C)   Ht 6' 6 (1.981 m)   Wt 238 lb (108 kg)   SpO2 98%   BMI 27.50 kg/m      08/25/2023   11:25 AM 07/18/2023    10:06 AM 07/01/2023   10:09 AM  BP/Weight  Systolic BP 136 167 130  Diastolic BP 88 97 84  Wt. (Lbs) 238  241  BMI 27.5 kg/m2  27.85 kg/m2    Physical Exam Vitals and nursing note reviewed.  Constitutional:      General: He is not in acute distress.    Appearance: Normal appearance.  HENT:     Head:     Comments: Erythema of the scalp noted.  No current flaking. Pulmonary:     Effort: Pulmonary effort is normal. No respiratory distress.  Neurological:     Mental Status: He is alert.  Psychiatric:        Mood and Affect: Mood normal.        Behavior: Behavior normal.     Lab Results  Component Value Date   WBC 5.1 01/29/2021   HGB 16.4 01/29/2021   HCT 47.9 01/29/2021   PLT 254 01/29/2021   GLUCOSE 91 07/06/2023   CHOL 263 (H) 07/06/2023   TRIG 222 (H) 07/06/2023   HDL 54 07/06/2023   LDLCALC 168 (H) 07/06/2023   ALT 25 01/29/2021   AST 27 01/29/2021  NA 140 07/06/2023   K 4.7 07/06/2023   CL 100 07/06/2023   CREATININE 1.18 07/06/2023   BUN 14 07/06/2023   CO2 23 07/06/2023   TSH 4.530 (H) 07/06/2023   HGBA1C 5.1 03/13/2016     Assessment & Plan:  Seborrheic dermatitis Assessment & Plan: History and exam most consistent with seborrheic dermatitis.  Treating with ketoconazole  and clobetasol .  Orders: -     Ketoconazole ; Apply 5 to 10 mL to wet scalp, lather, leave on 3 to 5 minutes, and rinse; apply twice weekly for 2 to 4 weeks.  Dispense: 120 mL; Refill: 0 -     Clobetasol  Propionate; Apply 1 Application topically daily. For up to 2 weeks.  Dispense: 50 mL; Refill: 0    Follow-up:  Return if symptoms worsen or fail to improve.  Jacqulyn Ahle DO [facility]

## 2023-08-25 NOTE — Assessment & Plan Note (Signed)
 History and exam most consistent with seborrheic dermatitis.  Treating with ketoconazole  and clobetasol .

## 2023-09-08 DIAGNOSIS — L218 Other seborrheic dermatitis: Secondary | ICD-10-CM | POA: Diagnosis not present

## 2023-10-20 DIAGNOSIS — S0501XD Injury of conjunctiva and corneal abrasion without foreign body, right eye, subsequent encounter: Secondary | ICD-10-CM | POA: Diagnosis not present

## 2023-12-06 ENCOUNTER — Telehealth: Admitting: Emergency Medicine

## 2023-12-06 DIAGNOSIS — R197 Diarrhea, unspecified: Secondary | ICD-10-CM

## 2023-12-06 NOTE — Progress Notes (Signed)
  Because you have pus and mucus in your diarrhea, I feel your condition warrants further evaluation and I recommend that you be seen in a face-to-face visit.   NOTE: There will be NO CHARGE for this E-Visit   If you are having a true medical emergency, please call 911.     For an urgent face to face visit, [facility] has multiple urgent care centers for your convenience.  Click the link below for the full list of locations and hours, walk-in wait times, appointment scheduling options and driving directions:  Urgent Care - [place], [place], [place], [place], [place], [place]  [place]     Your MyChart E-visit questionnaire answers were reviewed by a board certified advanced clinical practitioner to complete your personal care plan based on your specific symptoms.    Thank you for using e-Visits.

## 2023-12-07 ENCOUNTER — Encounter: Payer: Self-pay | Admitting: Family Medicine

## 2023-12-07 ENCOUNTER — Ambulatory Visit: Admitting: Family Medicine

## 2023-12-07 VITALS — BP 151/84 | HR 80 | Ht 78.0 in | Wt 242.0 lb

## 2023-12-07 DIAGNOSIS — R197 Diarrhea, unspecified: Secondary | ICD-10-CM | POA: Insufficient documentation

## 2023-12-07 NOTE — Progress Notes (Signed)
 Subjective:  Patient ID: Charles Estrada, male    DOB: [birth year]  Age: 33 y.o. MRN: [id number]  CC:   Chief Complaint  Patient presents with   Diarrhea    For one week     HPI:  33 year old male presents for evaluation of diarrhea.  Patient reports 1 week history of diarrhea.  Occurred after he ate some sushi.  No significant abdominal pain.  No fever.  No resolution with Florastor and Pepto-Bismol.  No hematochezia or melena.  No other complaints at this time.  Patient Active Problem List   Diagnosis Date Noted   Diarrhea of presumed infectious origin 12/07/2023   Seborrheic dermatitis 08/25/2023   Subclinical hypothyroidism 07/01/2023   Annual physical exam 07/11/2021   Hyperlipidemia 01/13/2021   Tear of left glenoid labrum 01/13/2021   Essential hypertension 04/10/2016    Social Hx   Social History   Socioeconomic History   Marital status: Married    Spouse name: Not on file   Number of children: Not on file   Years of education: Not on file   Highest education level: Not on file  Occupational History   Not on file  Tobacco Use   Smoking status: Never   Smokeless tobacco: Never  Substance and Sexual Activity   Alcohol use: Yes    Alcohol/week: 10.0 standard drinks of alcohol    Types: 10 Cans of beer per week   Drug use: No   Sexual activity: Yes  Other Topics Concern   Not on file  Social History Narrative   Not on file   Social Drivers of Health   Financial Resource Strain: Not on file  Food Insecurity: Not on file  Transportation Needs: Not on file  Physical Activity: Not on file  Stress: Not on file  Social Connections: Not on file    Review of Systems Per HPI  Objective:  BP (!) 151/84   Pulse 80   Ht 6' 6 (1.981 m)   Wt 242 lb (109.8 kg)   SpO2 97%   BMI 27.97 kg/m      12/07/2023   10:45 AM 08/25/2023   11:25 AM 07/18/2023   10:06 AM  BP/Weight  Systolic BP 151 136 167  Diastolic BP 84 88 97  Wt. (Lbs) 242 238   BMI 27.97  kg/m2 27.5 kg/m2     Physical Exam Vitals and nursing note reviewed.  Constitutional:      General: He is not in acute distress.    Appearance: Normal appearance.  HENT:     Head: Normocephalic and atraumatic.  Cardiovascular:     Rate and Rhythm: Normal rate and regular rhythm.  Pulmonary:     Effort: Pulmonary effort is normal.     Breath sounds: Normal breath sounds.  Abdominal:     General: There is no distension.     Palpations: Abdomen is soft.     Tenderness: There is no abdominal tenderness.  Neurological:     Mental Status: He is alert.     Lab Results  Component Value Date   WBC 5.1 01/29/2021   HGB 16.4 01/29/2021   HCT 47.9 01/29/2021   PLT 254 01/29/2021   GLUCOSE 91 07/06/2023   CHOL 263 (H) 07/06/2023   TRIG 222 (H) 07/06/2023   HDL 54 07/06/2023   LDLCALC 168 (H) 07/06/2023   ALT 25 01/29/2021   AST 27 01/29/2021   NA 140 07/06/2023   K 4.7 07/06/2023   CL  100 07/06/2023   CREATININE 1.18 07/06/2023   BUN 14 07/06/2023   CO2 23 07/06/2023   TSH 4.530 (H) 07/06/2023   HGBA1C 5.1 03/13/2016     Assessment & Plan:  Diarrhea of presumed infectious origin Assessment & Plan: Persistent diarrhea.  Concern for infectious etiology.  Obtaining GI profile.  Orders: -     GI Profile, Stool, PCR    Follow-up:  Pending results  Jacqulyn Ahle DO [facility]

## 2023-12-07 NOTE — Assessment & Plan Note (Signed)
 Persistent diarrhea.  Concern for infectious etiology.  Obtaining GI profile.

## 2023-12-09 ENCOUNTER — Ambulatory Visit: Payer: Self-pay | Admitting: Family Medicine

## 2023-12-09 LAB — GI PROFILE, STOOL, PCR

## 2024-01-13 ENCOUNTER — Other Ambulatory Visit (HOSPITAL_COMMUNITY): Payer: Self-pay
